# Patient Record
Sex: Male | Born: 1990 | Race: Black or African American | Hispanic: No | Marital: Single | Smoking: Former smoker
Health system: Southern US, Community
[De-identification: ages and names within clinical notes are randomized; demographics above are authoritative.]

## PROBLEM LIST (undated history)

## (undated) DIAGNOSIS — G4730 Sleep apnea, unspecified: Secondary | ICD-10-CM

---

## 2014-04-26 ENCOUNTER — Emergency Department (HOSPITAL_COMMUNITY)
Admission: EM | Admit: 2014-04-26 | Discharge: 2014-04-26 | Disposition: A | Discharge status: Home / Self Care | Payer: Self-pay | Attending: Emergency Medicine | Admitting: Emergency Medicine

## 2014-04-26 ENCOUNTER — Encounter (HOSPITAL_COMMUNITY): Payer: Self-pay | Admitting: Emergency Medicine

## 2014-04-26 DIAGNOSIS — Y9289 Other specified places as the place of occurrence of the external cause: Secondary | ICD-10-CM | POA: Insufficient documentation

## 2014-04-26 DIAGNOSIS — IMO0002 Reserved for concepts with insufficient information to code with codable children: Secondary | ICD-10-CM | POA: Insufficient documentation

## 2014-04-26 DIAGNOSIS — Y9389 Activity, other specified: Secondary | ICD-10-CM | POA: Insufficient documentation

## 2014-04-26 DIAGNOSIS — S01511A Laceration without foreign body of lip, initial encounter: Secondary | ICD-10-CM

## 2014-04-26 DIAGNOSIS — F17200 Nicotine dependence, unspecified, uncomplicated: Secondary | ICD-10-CM | POA: Insufficient documentation

## 2014-04-26 DIAGNOSIS — S01501A Unspecified open wound of lip, initial encounter: Secondary | ICD-10-CM | POA: Insufficient documentation

## 2014-04-26 MED ORDER — TRAMADOL HCL 50 MG PO TABS
50.0000 mg | ORAL_TABLET | Freq: Once | ORAL | Status: AC
  Administered 2014-04-26: 50 mg via ORAL
  Filled 2014-04-26: qty 1

## 2014-04-26 MED ORDER — TRAMADOL HCL 50 MG PO TABS
50.0000 mg | ORAL_TABLET | Freq: Four times a day (QID) | ORAL | Status: DC | PRN
Start: 2014-04-26 — End: 2017-09-20

## 2014-04-26 NOTE — ED Provider Notes (Signed)
CSN: [id number]     Arrival date & time 04/26/14  0201 History   First MD Initiated Contact with Patient 04/26/14 0301     Chief Complaint  Patient presents with  . Lip Laceration     (Consider location/radiation/quality/duration/timing/severity/associated sxs/prior Treatment) Patient is a 23 y.o. male presenting with scalp laceration. The history is provided by the patient.  Head Laceration This is a new problem. The current episode started 1 to 2 hours ago. The problem occurs constantly. The problem has not changed since onset.Pertinent negatives include no chest pain, no abdominal pain, no headaches and no shortness of breath. Nothing aggravates the symptoms. Nothing relieves the symptoms. He has tried nothing for the symptoms. The treatment provided no relief.    History reviewed. No pertinent past medical history. History reviewed. No pertinent past surgical history. No family history on file. History  Substance Use Topics  . Smoking status: Current Every Day Smoker  . Smokeless tobacco: Not on file  . Alcohol Use: Yes    Review of Systems  Respiratory: Negative for shortness of breath.   Cardiovascular: Negative for chest pain.  Gastrointestinal: Negative for abdominal pain.  Neurological: Negative for headaches.  All other systems reviewed and are negative.     Allergies  Review of patient's allergies indicates no known allergies.  Home Medications   Prior to Admission medications   Not on File   BP 126/74  Pulse 66  Temp(Src) 98.6 F (37 C) (Oral)  Resp 16  Wt 133 lb 2 oz (60.385 kg)  SpO2 98% Physical Exam  Constitutional: He is oriented to person, place, and time. He appears well-developed and well-nourished. No distress.  HENT:  Head: Normocephalic and atraumatic. Head is without raccoon's eyes and without Battle's sign.  Right Ear: No mastoid tenderness. No hemotympanum.  Left Ear: No mastoid tenderness. No hemotympanum.  Mouth/Throat: Oropharynx is  clear and moist.    No trismus jaw appropriately seated  Eyes: Pupils are equal, round, and reactive to light.  Neck: Normal range of motion. Neck supple. No tracheal deviation present.  No midline tenderness  Cardiovascular: Normal rate, regular rhythm and intact distal pulses.   Pulmonary/Chest: Effort normal and breath sounds normal. He has no wheezes. He has no rales.  Abdominal: Soft. Bowel sounds are normal. There is no tenderness. There is no rebound and no guarding.  Musculoskeletal: Normal range of motion. He exhibits no edema and no tenderness.  Neurological: He is alert and oriented to person, place, and time.  Skin: Skin is warm and dry.  Psychiatric: He has a normal mood and affect.    ED Course  Procedures (including critical care time) Labs Review Labs Reviewed - No data to display  Imaging Review No results found.   EKG Interpretation None      MDM   Final diagnoses:  None    LACERATION REPAIR Performed by: Jasmine AwePALUMBO-RASCH,Chea Malan K Authorized by: Jasmine AwePALUMBO-RASCH,Cordarrel Stiefel K Consent: Verbal consent obtained. Risks and benefits: risks, benefits and alternatives were discussed Consent given by: patient Patient identity confirmed: provided demographic data Prepped and Draped in normal sterile fashion Wound explored  Laceration Location: lip   Laceration Length: . 9 cm  No Foreign Bodies seen or palpated  Anesthesia: local infiltration  Local anesthetic: lidocaine 1 %   Anesthetic total: 2 ml  Irrigation method: syringe Amount of cleaning: standard  Skin closure: vicryl rapide  Number of sutures: 2  Technique: interrupted  Patient tolerance: Patient tolerated the procedure well with no  immediate complications.   Absorbables.  Mouth rinses     Itamar Mcgowan K Saahil Herbster-Rasch, MD 04/26/14 [phone]

## 2014-04-26 NOTE — Discharge Instructions (Signed)
Absorbable Suture Repair °Absorbable sutures (stitches) hold skin together so you can heal. Keep skin wounds clean and dry for the next 2 to 3 days. Then, you may gently wash your wound and dress it with an antibiotic ointment as recommended. As your wound begins to heal, the sutures are no longer needed, and they typically begin to fall off. This will take 7 to 10 days. After 10 days, if your sutures are loose, you can remove them by wiping with a clean gauze pad or a cotton ball. Do not pull your sutures out. They should wipe away easily. If after 10 days they do not easily wipe away, have your caregiver take them out. Absorbable sutures may be used deep in a wound to help hold it together. If these stitches are below the skin, the body will absorb them completely in 3 to 4 weeks.  °You may need a tetanus shot if: °· You cannot remember when you had your last tetanus shot. °· You have never had a tetanus shot. °If you get a tetanus shot, your arm may swell, get red, and feel warm to the touch. This is common and not a problem. If you need a tetanus shot and you choose not to have one, there is a rare chance of getting tetanus. Sickness from tetanus can be serious. °SEEK IMMEDIATE MEDICAL CARE IF: °· You have redness in the wound area. °· The wound area feels hot to the touch. °· You develop swelling in the wound area. °· You develop pain. °· There is fluid drainage from the wound. °Document Released: 11/16/2004 Document Revised: 01/01/2012 Document Reviewed: 02/28/2011 °ExitCare® Patient Information ©2015 [employer]. This information is not intended to replace advice given to you by your health care provider. Make sure you discuss any questions you have with your health care provider. ° °
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
## 2014-10-05 ENCOUNTER — Encounter (HOSPITAL_COMMUNITY): Payer: Self-pay | Admitting: *Deleted

## 2014-10-05 ENCOUNTER — Emergency Department (HOSPITAL_COMMUNITY)
Admission: EM | Admit: 2014-10-05 | Discharge: 2014-10-05 | Disposition: A | Discharge status: Home / Self Care | Payer: BC Managed Care – PPO | Attending: Emergency Medicine | Admitting: Emergency Medicine

## 2014-10-05 DIAGNOSIS — J0110 Acute frontal sinusitis, unspecified: Secondary | ICD-10-CM | POA: Insufficient documentation

## 2014-10-05 DIAGNOSIS — Z720 Tobacco use: Secondary | ICD-10-CM | POA: Insufficient documentation

## 2014-10-05 DIAGNOSIS — R51 Headache: Secondary | ICD-10-CM | POA: Diagnosis present

## 2014-10-05 DIAGNOSIS — Z79899 Other long term (current) drug therapy: Secondary | ICD-10-CM | POA: Insufficient documentation

## 2014-10-05 MED ORDER — TRAMADOL HCL 50 MG PO TABS
50.0000 mg | ORAL_TABLET | Freq: Once | ORAL | Status: AC
  Administered 2014-10-05: 50 mg via ORAL
  Filled 2014-10-05: qty 1

## 2014-10-05 MED ORDER — AMOXICILLIN 500 MG PO CAPS: 500.0000 mg | ORAL_CAPSULE | Freq: Three times a day (TID) | ORAL | Status: DC

## 2014-10-05 NOTE — Discharge Instructions (Signed)
Amoxicillin as prescribed.  Sudafed over-the-counter as needed as a decongestant.  Ibuprofen 600 mg every 6 hours as needed for pain.  Return to the emergency department if your symptoms substantially worsen or change.   Upper Respiratory Infection, Adult An upper respiratory infection (URI) is also sometimes known as the common cold. The upper respiratory tract includes the nose, sinuses, throat, trachea, and bronchi. Bronchi are the airways leading to the lungs. Most people improve within 1 week, but symptoms can last up to 2 weeks. A residual cough may last even longer.  CAUSES Many different viruses can infect the tissues lining the upper respiratory tract. The tissues become irritated and inflamed and often become very moist. Mucus production is also common. A cold is contagious. You can easily spread the virus to others by oral contact. This includes kissing, sharing a glass, coughing, or sneezing. Touching your mouth or nose and then touching a surface, which is then touched by another person, can also spread the virus. SYMPTOMS  Symptoms typically develop 1 to 3 days after you come in contact with a cold virus. Symptoms vary from person to person. They may include:  Runny nose.  Sneezing.  Nasal congestion.  Sinus irritation.  Sore throat.  Loss of voice (laryngitis).  Cough.  Fatigue.  Muscle aches.  Loss of appetite.  Headache.  Low-grade fever. DIAGNOSIS  You might diagnose your own cold based on familiar symptoms, since most people get a cold 2 to 3 times a year. Your caregiver can confirm this based on your exam. Most importantly, your caregiver can check that your symptoms are not due to another disease such as strep throat, sinusitis, pneumonia, asthma, or epiglottitis. Blood tests, throat tests, and X-rays are not necessary to diagnose a common cold, but they may sometimes be helpful in excluding other more serious diseases. Your caregiver will decide if any  further tests are required. RISKS AND COMPLICATIONS  You may be at risk for a more severe case of the common cold if you smoke cigarettes, have chronic heart disease (such as heart failure) or lung disease (such as asthma), or if you have a weakened immune system. The very young and very old are also at risk for more serious infections. Bacterial sinusitis, middle ear infections, and bacterial pneumonia can complicate the common cold. The common cold can worsen asthma and chronic obstructive pulmonary disease (COPD). Sometimes, these complications can require emergency medical care and may be life-threatening. PREVENTION  The best way to protect against getting a cold is to practice good hygiene. Avoid oral or hand contact with people with cold symptoms. Wash your hands often if contact occurs. There is no clear evidence that vitamin C, vitamin E, echinacea, or exercise reduces the chance of developing a cold. However, it is always recommended to get plenty of rest and practice good nutrition. TREATMENT  Treatment is directed at relieving symptoms. There is no cure. Antibiotics are not effective, because the infection is caused by a virus, not by bacteria. Treatment may include:  Increased fluid intake. Sports drinks offer valuable electrolytes, sugars, and fluids.  Breathing heated mist or steam (vaporizer or shower).  Eating chicken soup or other clear broths, and maintaining good nutrition.  Getting plenty of rest.  Using gargles or lozenges for comfort.  Controlling fevers with ibuprofen or acetaminophen as directed by your caregiver.  Increasing usage of your inhaler if you have asthma. Zinc gel and zinc lozenges, taken in the first 24 hours of the common  cold, can shorten the duration and lessen the severity of symptoms. Pain medicines may help with fever, muscle aches, and throat pain. A variety of non-prescription medicines are available to treat congestion and runny nose. Your caregiver  can make recommendations and may suggest nasal or lung inhalers for other symptoms.  HOME CARE INSTRUCTIONS   Only take over-the-counter or prescription medicines for pain, discomfort, or fever as directed by your caregiver.  Use a warm mist humidifier or inhale steam from a shower to increase air moisture. This may keep secretions moist and make it easier to breathe.  Drink enough water and fluids to keep your urine clear or pale yellow.  Rest as needed.  Return to work when your temperature has returned to normal or as your caregiver advises. You may need to stay home longer to avoid infecting others. You can also use a face mask and careful hand washing to prevent spread of the virus. SEEK MEDICAL CARE IF:   After the first few days, you feel you are getting worse rather than better.  You need your caregiver's advice about medicines to control symptoms.  You develop chills, worsening shortness of breath, or brown or red sputum. These may be signs of pneumonia.  You develop yellow or brown nasal discharge or pain in the face, especially when you bend forward. These may be signs of sinusitis.  You develop a fever, swollen neck glands, pain with swallowing, or white areas in the back of your throat. These may be signs of strep throat. SEEK IMMEDIATE MEDICAL CARE IF:   You have a fever.  You develop severe or persistent headache, ear pain, sinus pain, or chest pain.  You develop wheezing, a prolonged cough, cough up blood, or have a change in your usual mucus (if you have chronic lung disease).  You develop sore muscles or a stiff neck. Document Released: 04/04/2001 Document Revised: 01/01/2012 Document Reviewed: 01/14/2014 [facility] Patient Information 2015 [place], [place]. This information is not intended to replace advice given to you by your health care provider. Make sure you discuss any questions you have with your health care provider.

## 2014-10-05 NOTE — ED Provider Notes (Signed)
CSN: [id number]     Arrival date & time 10/05/14  0515 History   First MD Initiated Contact with Patient 10/05/14 0545     Chief Complaint  Patient presents with  . Headache     (Consider location/radiation/quality/duration/timing/severity/associated sxs/prior Treatment) HPI Comments: Patient is a [age] year old male with no significant past medical history. He presents with complaints of headache that he describes as "pressure behind his eyes". He is also describing pressure in his nose. He denies nasal discharge or fever. He states he has had sinus infections in the past and this feels similar.  Patient is a 23 y.o. male presenting with headaches. The history is provided by the patient.  Headache Pain location:  Frontal Quality:  Dull Radiates to:  Does not radiate Onset quality:  Gradual Duration:  1 week Timing:  Constant Progression:  Worsening Chronicity:  New Similar to prior headaches: yes   Relieved by:  Nothing Worsened by:  Nothing tried Ineffective treatments:  None tried Associated symptoms: congestion   Associated symptoms: no cough, no drainage and no fever     History reviewed. No pertinent past medical history. History reviewed. No pertinent past surgical history. No family history on file. History  Substance Use Topics  . Smoking status: Current Every Day Smoker  . Smokeless tobacco: Not on file  . Alcohol Use: Yes    Review of Systems  Constitutional: Negative for fever.  HENT: Positive for congestion. Negative for postnasal drip.   Respiratory: Negative for cough.   Neurological: Positive for headaches.  All other systems reviewed and are negative.     Allergies  Review of patient's allergies indicates no known allergies.  Home Medications   Prior to Admission medications   Medication Sig Start Date End Date Taking? Authorizing Provider  traMADol (ULTRAM) 50 MG tablet Take 1 tablet (50 mg total) by mouth every 6 (six) hours as  needed. Patient taking differently: Take 50 mg by mouth every 6 (six) hours as needed for moderate pain.  04/26/14  Yes April K Palumbo-Rasch, MD  Multiple Vitamin (MULTIVITAMIN WITH MINERALS) TABS tablet Take 1 tablet by mouth daily.    Historical Provider, MD   BP 119/76 mmHg  Pulse 60  Temp(Src) 97.8 F (36.6 C)  Resp 18  Ht 5\' 8"  (1.727 m)  Wt 131 lb (59.421 kg)  BMI 19.92 kg/m2  SpO2 98% Physical Exam  Constitutional: He is oriented to person, place, and time. He appears well-developed and well-nourished. No distress.  HENT:  Head: Normocephalic and atraumatic.  Mouth/Throat: Oropharynx is clear and moist.  There is maxillofacial and frontal sinus tenderness.  Neck: Normal range of motion. Neck supple.  Cardiovascular: Normal rate, regular rhythm and normal heart sounds.   No murmur heard. Pulmonary/Chest: Effort normal and breath sounds normal. No respiratory distress. He has no wheezes.  Abdominal: Soft.  Musculoskeletal: Normal range of motion. He exhibits no edema.  Lymphadenopathy:    He has no cervical adenopathy.  Neurological: He is alert and oriented to person, place, and time.  Skin: Skin is warm and dry. He is not diaphoretic.  Nursing note and vitals reviewed.   ED Course  Procedures (including critical care time) Labs Review Labs Reviewed - No data to display  Imaging Review No results found.   EKG Interpretation None      MDM   Final diagnoses:  None    We'll treat as sinusitis with amoxicillin and decongestants.    Geoffery Lyonsouglas Hawthorne Day, MD 10/05/14 [id number]

## 2014-10-05 NOTE — ED Notes (Signed)
The pt is c/o a headache and sinus pressure for 3 days  No n or v

## 2015-01-18 ENCOUNTER — Emergency Department (HOSPITAL_COMMUNITY)
Admission: EM | Admit: 2015-01-18 | Discharge: 2015-01-18 | Disposition: A | Discharge status: Home / Self Care | Payer: BLUE CROSS/BLUE SHIELD | Attending: Emergency Medicine | Admitting: Emergency Medicine

## 2015-01-18 ENCOUNTER — Encounter (HOSPITAL_COMMUNITY): Payer: Self-pay | Admitting: Neurology

## 2015-01-18 DIAGNOSIS — Z8669 Personal history of other diseases of the nervous system and sense organs: Secondary | ICD-10-CM | POA: Diagnosis not present

## 2015-01-18 DIAGNOSIS — R197 Diarrhea, unspecified: Secondary | ICD-10-CM | POA: Diagnosis not present

## 2015-01-18 DIAGNOSIS — R945 Abnormal results of liver function studies: Secondary | ICD-10-CM | POA: Insufficient documentation

## 2015-01-18 DIAGNOSIS — R7989 Other specified abnormal findings of blood chemistry: Secondary | ICD-10-CM

## 2015-01-18 DIAGNOSIS — Z720 Tobacco use: Secondary | ICD-10-CM | POA: Insufficient documentation

## 2015-01-18 DIAGNOSIS — R110 Nausea: Secondary | ICD-10-CM | POA: Insufficient documentation

## 2015-01-18 DIAGNOSIS — R109 Unspecified abdominal pain: Secondary | ICD-10-CM | POA: Diagnosis present

## 2015-01-18 HISTORY — DX: Sleep apnea, unspecified: G47.30

## 2015-01-18 LAB — CBC WITH DIFFERENTIAL/PLATELET
Basophils Absolute: 0 10*3/uL (ref 0.0–0.1)
Basophils Relative: 0 % (ref 0–1)
EOS PCT: 1 % (ref 0–5)
Eosinophils Absolute: 0 10*3/uL (ref 0.0–0.7)
HCT: 45.3 % (ref 39.0–52.0)
Hemoglobin: 16.4 g/dL (ref 13.0–17.0)
LYMPHS PCT: 40 % (ref 12–46)
Lymphs Abs: 1.6 10*3/uL (ref 0.7–4.0)
MCH: 31.5 pg (ref 26.0–34.0)
MCHC: 36.2 g/dL — ABNORMAL HIGH (ref 30.0–36.0)
MCV: 86.9 fL (ref 78.0–100.0)
Monocytes Absolute: 0.7 10*3/uL (ref 0.1–1.0)
Monocytes Relative: 18 % — ABNORMAL HIGH (ref 3–12)
NEUTROS ABS: 1.6 10*3/uL — AB (ref 1.7–7.7)
Neutrophils Relative %: 41 % — ABNORMAL LOW (ref 43–77)
Platelets: 217 10*3/uL (ref 150–400)
RBC: 5.21 MIL/uL (ref 4.22–5.81)
RDW: 11.9 % (ref 11.5–15.5)
WBC: 4 10*3/uL (ref 4.0–10.5)

## 2015-01-18 LAB — COMPREHENSIVE METABOLIC PANEL
ALBUMIN: 4 g/dL (ref 3.5–5.2)
ALK PHOS: 50 U/L (ref 39–117)
ALT: 96 U/L — AB (ref 0–53)
AST: 339 U/L — AB (ref 0–37)
Anion gap: 11 (ref 5–15)
BUN: 12 mg/dL (ref 6–23)
CO2: 27 mmol/L (ref 19–32)
CREATININE: 1.22 mg/dL (ref 0.50–1.35)
Calcium: 9.3 mg/dL (ref 8.4–10.5)
Chloride: 95 mmol/L — ABNORMAL LOW (ref 96–112)
GFR calc non Af Amer: 82 mL/min — ABNORMAL LOW (ref >90)
Glucose, Bld: 100 mg/dL — ABNORMAL HIGH (ref 70–99)
Potassium: 4.3 mmol/L (ref 3.5–5.1)
SODIUM: 133 mmol/L — AB (ref 135–145)
TOTAL PROTEIN: 7 g/dL (ref 6.0–8.3)
Total Bilirubin: 1.1 mg/dL (ref 0.3–1.2)

## 2015-01-18 LAB — LIPASE, BLOOD: LIPASE: 17 U/L (ref 11–59)

## 2015-01-18 MED ORDER — ONDANSETRON 4 MG PO TBDP: ORAL_TABLET | ORAL | Status: DC

## 2015-01-18 MED ORDER — ONDANSETRON 4 MG PO TBDP
4.0000 mg | ORAL_TABLET | Freq: Once | ORAL | Status: AC
  Administered 2015-01-18: 4 mg via ORAL
  Filled 2015-01-18: qty 1

## 2015-01-18 NOTE — ED Notes (Signed)
Pt reports can't eat for 3 days b/c it causes lower abdominal pain. Reports gurgling in abdomen and diarrhea. No vomiting. Also feels body temp is warm but not fever. Has stuffy nose and congestion and can't sleep.

## 2015-01-18 NOTE — Discharge Instructions (Signed)
Continue stay hydrated, use Zofran as needed for nausea. See a physician if you develop focal abdominal pain, fevers, recurrent vomiting or worsening symptoms. Have your liver function retested by primary doctor. Avoid alcohol.  If you were given medicines take as directed.  If you are on coumadin or contraceptives realize their levels and effectiveness is altered by many different medicines.  If you have any reaction (rash, tongues swelling, other) to the medicines stop taking and see a physician.   Please follow up as directed and return to the ER or see a physician for new or worsening symptoms.  Thank you. Filed Vitals:   01/18/15 1201  BP: 122/60  Pulse: 88  Temp: 99.2 F (37.3 C)  TempSrc: Oral  Resp: 18  SpO2: 95%

## 2015-01-18 NOTE — ED Provider Notes (Signed)
CSN: [id number]     Arrival date & time 01/18/15  1157 History   First MD Initiated Contact with Patient 01/18/15 1409     Chief Complaint  Patient presents with  . Abdominal Pain     (Consider location/radiation/quality/duration/timing/severity/associated sxs/prior Treatment) HPI Comments: 24 year old male presents to ER for 2 separate complaints. Patient has been congested and difficulty with sleeping that he said in the past, patient has been on Ambien in the past. Patient does not have a regular sleeping schedule. Patient also has had mild crampy abdominal pain lower and recurrent nausea with mild diarrhea nonbloody. No significant sick contacts no recent antibiotics no recent travel. No abdominal surgery history. Patient has tolerated oral fluids including Gatorade. Symptoms intermittent. Patient denies IV drug use, significant alcohol abuse, hepatitis history.  Patient is a 24 y.o. male presenting with abdominal pain. The history is provided by the patient.  Abdominal Pain Associated symptoms: diarrhea and nausea   Associated symptoms: no chest pain, no chills, no dysuria, no fever, no shortness of breath and no vomiting     Past Medical History  Diagnosis Date  . Sleep apnea    History reviewed. No pertinent past surgical history. No family history on file. History  Substance Use Topics  . Smoking status: Current Every Day Smoker  . Smokeless tobacco: Not on file  . Alcohol Use: Yes    Review of Systems  Constitutional: Positive for appetite change. Negative for fever and chills.  HENT: Negative for congestion.   Eyes: Negative for visual disturbance.  Respiratory: Negative for shortness of breath.   Cardiovascular: Negative for chest pain.  Gastrointestinal: Positive for nausea, abdominal pain and diarrhea. Negative for vomiting.  Genitourinary: Negative for dysuria and flank pain.  Musculoskeletal: Negative for back pain, neck pain and neck stiffness.  Skin: Negative  for rash.  Neurological: Negative for light-headedness and headaches.      Allergies  Review of patient's allergies indicates no known allergies.  Home Medications   Prior to Admission medications   Medication Sig Start Date End Date Taking? Authorizing Provider  amoxicillin (AMOXIL) 500 MG capsule Take 1 capsule (500 mg total) by mouth 3 (three) times daily. Patient not taking: Reported on 01/18/2015 10/05/14   Geoffery Lyons, MD  ondansetron (ZOFRAN ODT) 4 MG disintegrating tablet  ODT q4 hours prn nausea/vomit 01/18/15   Blane Ohara, MD  traMADol (ULTRAM) 50 MG tablet Take 1 tablet (50 mg total) by mouth every 6 (six) hours as needed. Patient not taking: Reported on 01/18/2015 04/26/14   April Palumbo, MD   BP 122/60 mmHg  Pulse 88  Temp(Src) 99.2 F (37.3 C) (Oral)  Resp 18  SpO2 95% Physical Exam  Constitutional: He is oriented to person, place, and time. He appears well-developed and well-nourished.  HENT:  Head: Normocephalic and atraumatic.  Eyes: Conjunctivae are normal. Right eye exhibits no discharge. Left eye exhibits no discharge.  Neck: Normal range of motion. Neck supple. No tracheal deviation present.  Cardiovascular: Normal rate and regular rhythm.   Pulmonary/Chest: Effort normal and breath sounds normal.  Abdominal: Soft. He exhibits no distension. There is no tenderness. There is no guarding.  Musculoskeletal: He exhibits no edema.  Neurological: He is alert and oriented to person, place, and time.  Skin: Skin is warm. No rash noted.  Psychiatric: He has a normal mood and affect.  Nursing note and vitals reviewed.   ED Course  Procedures (including critical care time)  EMERGENCY DEPARTMENT BILIARY ULTRASOUND INTERPRETATION "  Study: Limited Abdominal Ultrasound of the gallbladder and common bile duct."  INDICATIONS: Abdominal pain and Nausea Indication: Multiple views of the gallbladder and common bile duct were obtained in real-time with a  Multi-frequency probe." PERFORMED BY:  Myself IMAGES ARCHIVED?: Yes FINDINGS: Gallstones absent, Gallbladder wall normal in thickness, Sonographic Murphy's sign absent and Common bile duct normal in size LIMITATIONS: Bowel Gas INTERPRETATION: Normal  CPT Code [id number] (limited abdominal)    Labs Review Labs Reviewed  CBC WITH DIFFERENTIAL/PLATELET - Abnormal; Notable for the following:    MCHC 36.2 (*)    Neutrophils Relative % 41 (*)    Neutro Abs 1.6 (*)    Monocytes Relative 18 (*)    All other components within normal limits  COMPREHENSIVE METABOLIC PANEL - Abnormal; Notable for the following:    Sodium 133 (*)    Chloride 95 (*)    Glucose, Bld 100 (*)    AST 339 (*)    ALT 96 (*)    GFR calc non Af Amer 82 (*)    All other components within normal limits  LIPASE, BLOOD  URINALYSIS, ROUTINE W REFLEX MICROSCOPIC    Imaging Review No results found.   EKG Interpretation None      MDM   Final diagnoses:  LFT elevation  Nausea  Diarrhea   Patient presents with recurrent nausea and mild clinical dehydration. Patient overall well-appearing no peritonitis no abdominal pain on exam. Bedside ultrasound done due to nausea and LFT elevation no significant findings. Likely viral process, discussed avoiding alcohol and close follow-up outpatient. Discussed melatonin and good sleep schedule.  Results and differential diagnosis were discussed with the patient/parent/guardian. Close follow up outpatient was discussed, comfortable with the plan.   Medications  ondansetron (ZOFRAN-ODT) disintegrating tablet 4 mg (not administered)    Filed Vitals:   01/18/15 1201  BP: 122/60  Pulse: 88  Temp: 99.2 F (37.3 C)  TempSrc: Oral  Resp: 18  SpO2: 95%    Final diagnoses:  LFT elevation  Nausea  Diarrhea        Blane OharaJoshua Marcia Hartwell, MD 01/18/15 1454

## 2015-06-08 DIAGNOSIS — Y999 Unspecified external cause status: Secondary | ICD-10-CM | POA: Insufficient documentation

## 2015-06-08 DIAGNOSIS — Z720 Tobacco use: Secondary | ICD-10-CM | POA: Insufficient documentation

## 2015-06-08 DIAGNOSIS — X58XXXA Exposure to other specified factors, initial encounter: Secondary | ICD-10-CM | POA: Insufficient documentation

## 2015-06-08 DIAGNOSIS — Z79899 Other long term (current) drug therapy: Secondary | ICD-10-CM | POA: Insufficient documentation

## 2015-06-08 DIAGNOSIS — Z8669 Personal history of other diseases of the nervous system and sense organs: Secondary | ICD-10-CM | POA: Insufficient documentation

## 2015-06-08 DIAGNOSIS — S8392XA Sprain of unspecified site of left knee, initial encounter: Secondary | ICD-10-CM | POA: Insufficient documentation

## 2015-06-08 DIAGNOSIS — Y9302 Activity, running: Secondary | ICD-10-CM | POA: Insufficient documentation

## 2015-06-08 DIAGNOSIS — Y929 Unspecified place or not applicable: Secondary | ICD-10-CM | POA: Insufficient documentation

## 2015-06-08 NOTE — ED Notes (Signed)
Chronic lt knee pain since 2009.  The pain became worse today

## 2015-06-09 ENCOUNTER — Encounter (HOSPITAL_COMMUNITY): Payer: Self-pay | Admitting: *Deleted

## 2015-06-09 ENCOUNTER — Emergency Department (HOSPITAL_COMMUNITY): Payer: BLUE CROSS/BLUE SHIELD

## 2015-06-09 ENCOUNTER — Emergency Department (HOSPITAL_COMMUNITY)
Admission: EM | Admit: 2015-06-09 | Discharge: 2015-06-09 | Disposition: A | Discharge status: Home / Self Care | Payer: Self-pay | Attending: Emergency Medicine | Admitting: Emergency Medicine

## 2015-06-09 DIAGNOSIS — S8392XA Sprain of unspecified site of left knee, initial encounter: Secondary | ICD-10-CM

## 2015-06-09 DIAGNOSIS — M25562 Pain in left knee: Secondary | ICD-10-CM

## 2015-06-09 MED ORDER — HYDROCODONE-ACETAMINOPHEN 5-325 MG PO TABS: 1.0000 | ORAL_TABLET | Freq: Four times a day (QID) | ORAL | Status: DC | PRN

## 2015-06-09 MED ORDER — HYDROCODONE-ACETAMINOPHEN 5-325 MG PO TABS
1.0000 | ORAL_TABLET | Freq: Once | ORAL | Status: AC
  Administered 2015-06-09: 1 via ORAL
  Filled 2015-06-09: qty 1

## 2015-06-09 MED ORDER — NAPROXEN 500 MG PO TABS: 500.0000 mg | ORAL_TABLET | Freq: Two times a day (BID) | ORAL | Status: DC | PRN

## 2015-06-09 NOTE — ED Provider Notes (Signed)
CSN: [id number]     Arrival date & time 06/08/15  2341 History   First MD Initiated Contact with Patient 06/09/15 0038     Chief Complaint  Patient presents with  . Knee Pain     (Consider location/radiation/quality/duration/timing/severity/associated sxs/prior Treatment) HPI Comments: Jonathan Curry is a 24 y.o. male who presents to the ED with complaints of sudden onset left knee pain that began after he was running upstairs at around 6 PM. He reports he has had an injury to this knee in the past, and believes he reinjured it. He reports 7/10 intermittent sharp lateral left knee pain that intermittently radiates down into the calf, worse with extension and weightbearing, with no treatments tried prior to arrival. Associated symptoms include intermittent tingling down the posterior aspect of the left calf only occurs with certain movements. He denies any swelling, erythema, warmth, fevers, chills, chest pain, shortness of breath, abdominal pain, nausea, vomiting, dysuria, hematuria, numbness, or weakness. He has never been seen by an orthopedist in the past, he states that in 2009 he was running and twisted his knee and ever since then he has had knee pain.  Patient is a 24 y.o. male presenting with knee pain. The history is provided by the patient. No language interpreter was used.  Knee Pain Location:  Knee Time since incident:  6 hours Injury: no   Knee location:  L knee Pain details:    Quality:  Sharp   Radiates to:  L leg   Severity:  Moderate   Onset quality:  Sudden   Duration:  6 hours   Timing:  Intermittent   Progression:  Unchanged Chronicity:  Recurrent Prior injury to area:  Yes Relieved by:  None tried Worsened by:  Extension Ineffective treatments:  None tried Associated symptoms: tingling (intermittently down posterior calf, none ongoing)   Associated symptoms: no decreased ROM, no fever, no muscle weakness, no numbness and no swelling     Past Medical History   Diagnosis Date  . Sleep apnea    History reviewed. No pertinent past surgical history. No family history on file. Social History  Substance Use Topics  . Smoking status: Current Every Day Smoker  . Smokeless tobacco: None  . Alcohol Use: Yes    Review of Systems  Constitutional: Negative for fever and chills.  Respiratory: Negative for shortness of breath.   Cardiovascular: Negative for chest pain.  Gastrointestinal: Negative for nausea, vomiting and abdominal pain.  Genitourinary: Negative for dysuria and hematuria.  Musculoskeletal: Positive for arthralgias (L knee). Negative for myalgias.  Skin: Negative for color change and wound.  Allergic/Immunologic: Negative for immunocompromised state.  Neurological: Negative for weakness and numbness.  Psychiatric/Behavioral: Negative for confusion.    10 Systems reviewed and are negative for acute change except as noted in the HPI.   Allergies  Review of patient's allergies indicates no known allergies.  Home Medications   Prior to Admission medications   Medication Sig Start Date End Date Taking? Authorizing Provider  amoxicillin (AMOXIL) 500 MG capsule Take 1 capsule (500 mg total) by mouth 3 (three) times daily. Patient not taking: Reported on 01/18/2015 10/05/14   Geoffery Lyons, MD  ondansetron (ZOFRAN ODT) 4 MG disintegrating tablet  ODT q4 hours prn nausea/vomit 01/18/15   Blane Ohara, MD  traMADol (ULTRAM) 50 MG tablet Take 1 tablet (50 mg total) by mouth every 6 (six) hours as needed. Patient not taking: Reported on 01/18/2015 04/26/14   April Palumbo, MD   BP  114/72 mmHg  Pulse 74  Temp(Src) 97.8 F (36.6 C)  Resp 16  Ht 5\' 8"  (1.727 m)  Wt 127 lb 5 oz (57.749 kg)  BMI 19.36 kg/m2  SpO2 97% Physical Exam  Constitutional: He is oriented to person, place, and time. Vital signs are normal. He appears well-developed and well-nourished.  Non-toxic appearance. No distress.  Afebrile, nontoxic, NAD  HENT:  Head:  Normocephalic and atraumatic.  Mouth/Throat: Mucous membranes are normal.  Eyes: Conjunctivae and EOM are normal. Right eye exhibits no discharge. Left eye exhibits no discharge.  Neck: Normal range of motion. Neck supple.  Cardiovascular: Normal rate and intact distal pulses.   Pulmonary/Chest: Effort normal. No respiratory distress.  Abdominal: Normal appearance. He exhibits no distension.  Musculoskeletal: Normal range of motion.       Left knee: He exhibits normal range of motion, no swelling, no effusion, no deformity, no laceration, no erythema, normal alignment, no LCL laxity, normal patellar mobility and no MCL laxity. Tenderness found. Lateral joint line tenderness noted.  L knee with FROM intact, with lateral joint line TTP, no swelling/effusion/deformity, no bruising/abrasions/erythema/warmth, no abnormal alignment or patellar mobility, no varus/valgus laxity, neg anterior drawer test, no crepitus. Strength and sensation grossly intact, distal pulses intact. Difficulty with weight bearing. No calf tenderness.    Neurological: He is alert and oriented to person, place, and time. He has normal strength. No sensory deficit.  Skin: Skin is warm, dry and intact. No rash noted.  Psychiatric: He has a normal mood and affect.  Nursing note and vitals reviewed.   ED Course  Procedures (including critical care time) Labs Review Labs Reviewed - No data to display  Imaging Review Dg Knee Complete 4 Views Left  06/09/2015   CLINICAL DATA:  Left knee pain after running upstairs. Initial encounter.  EXAM: LEFT KNEE - COMPLETE 4+ VIEW  COMPARISON:  None.  FINDINGS: There is no evidence of fracture, dislocation, or joint effusion. There is no evidence of arthropathy or other focal bone abnormality. Soft tissues are unremarkable.  IMPRESSION: Negative.   Electronically Signed   By: Marnee Spring M.D.   On: 06/09/2015 01:34   I have personally reviewed and evaluated these images and lab results  as part of my medical decision-making.   EKG Interpretation None      MDM   Final diagnoses:  Left knee sprain, initial encounter  Left knee pain    24 y.o. male here with acute on chronic L knee pain after running up stairs. NVI with soft compartments. Tenderness near lateral aspect. Some tingling posteriorly intermittently in calf but sensation intact. Will give pain meds and xray to eval for fx. Will reassess shortly.  1:50 AM Xray neg. Likely meniscal injury vs ligamentous injury. Will tx with knee sleeve and crutches, discussed RICE therapy, will have him f/up with ortho in 1wk, and will send home with pain meds. I explained the diagnosis and have given explicit precautions to return to the ER including for any other new or worsening symptoms. The patient understands and accepts the medical plan as it's been dictated and I have answered their questions. Discharge instructions concerning home care and prescriptions have been given. The patient is STABLE and is discharged to home in good condition.   BP 114/72 mmHg  Pulse 74  Temp(Src) 97.8 F (36.6 C)  Resp 16  Ht 5\' 8"  (1.727 m)  Wt 127 lb 5 oz (57.749 kg)  BMI 19.36 kg/m2  SpO2 97%  Meds ordered this encounter  Medications  . HYDROcodone-acetaminophen (NORCO/VICODIN) 5-325 MG per tablet 1 tablet    Sig:   . naproxen (NAPROSYN) 500 MG tablet    Sig: Take 1 tablet (500 mg total) by mouth 2 (two) times daily as needed for mild pain, moderate pain or headache (TAKE WITH MEALS.).    Dispense:  20 tablet    Refill:  0  . HYDROcodone-acetaminophen (NORCO) 5-325 MG per tablet    Sig: Take 1 tablet by mouth every 6 (six) hours as needed for severe pain.    Dispense:  6 tablet    Refill:  0       Sameen Leas Camprubi-Soms, PA-C 06/09/15 0155  Derwood Kaplan, MD 06/10/15 1000

## 2015-06-09 NOTE — Discharge Instructions (Signed)
Wear knee sleeve for at least 2 weeks for stabilization of knee. Use crutches as needed for comfort. Ice and elevate knee throughout the day. Alternate between naprosyn and norco for pain relief. Do not drive or operate machinery with pain medication use. Call orthopedic follow up today or tomorrow to schedule followup appointment for recheck of ongoing knee pain in one to two weeks that can be canceled with a 24-48 hour notice if complete resolution of pain. Return to the ER for changes or worsening symptoms.   Knee Pain The knee is the complex joint between your thigh and your lower leg. It is made up of bones, tendons, ligaments, and cartilage. The bones that make up the knee are:  The femur in the thigh.  The tibia and fibula in the lower leg.  The patella or kneecap riding in the groove on the lower femur. CAUSES  Knee pain is a common complaint with many causes. A few of these causes are:  Injury, such as:  A ruptured ligament or tendon injury.  Torn cartilage.  Medical conditions, such as:  Gout  Arthritis  Infections  Overuse, over training, or overdoing a physical activity. Knee pain can be minor or severe. Knee pain can accompany debilitating injury. Minor knee problems often respond well to self-care measures or get well on their own. More serious injuries may need medical intervention or even surgery. SYMPTOMS The knee is complex. Symptoms of knee problems can vary widely. Some of the problems are:  Pain with movement and weight bearing.  Swelling and tenderness.  Buckling of the knee.  Inability to straighten or extend your knee.  Your knee locks and you cannot straighten it.  Warmth and redness with pain and fever.  Deformity or dislocation of the kneecap. DIAGNOSIS  Determining what is wrong may be very straight forward such as when there is an injury. It can also be challenging because of the complexity of the knee. Tests to make a diagnosis may  include:  Your caregiver taking a history and doing a physical exam.  Routine X-rays can be used to rule out other problems. X-rays will not reveal a cartilage tear. Some injuries of the knee can be diagnosed by:  Arthroscopy a surgical technique by which a small video camera is inserted through tiny incisions on the sides of the knee. This procedure is used to examine and repair internal knee joint problems. Tiny instruments can be used during arthroscopy to repair the torn knee cartilage (meniscus).  Arthrography is a radiology technique. A contrast liquid is directly injected into the knee joint. Internal structures of the knee joint then become visible on X-ray film.  An MRI scan is a non X-ray radiology procedure in which magnetic fields and a computer produce two- or three-dimensional images of the inside of the knee. Cartilage tears are often visible using an MRI scanner. MRI scans have largely replaced arthrography in diagnosing cartilage tears of the knee.  Blood work.  Examination of the fluid that helps to lubricate the knee joint (synovial fluid). This is done by taking a sample out using a needle and a syringe. TREATMENT The treatment of knee problems depends on the cause. Some of these treatments are:  Depending on the injury, proper casting, splinting, surgery, or physical therapy care will be needed.  Give yourself adequate recovery time. Do not overuse your joints. If you begin to get sore during workout routines, back off. Slow down or do fewer repetitions.  For repetitive  activities such as cycling or running, maintain your strength and nutrition.  Alternate muscle groups. For example, if you are a weight lifter, work the upper body on one day and the lower body the next.  Either tight or weak muscles do not give the proper support for your knee. Tight or weak muscles do not absorb the stress placed on the knee joint. Keep the muscles surrounding the knee strong.  Take  care of mechanical problems.  If you have flat feet, orthotics or special shoes may help. See your caregiver if you need help.  Arch supports, sometimes with wedges on the inner or outer aspect of the heel, can help. These can shift pressure away from the side of the knee most bothered by osteoarthritis.  A brace called an "unloader" brace also may be used to help ease the pressure on the most arthritic side of the knee.  If your caregiver has prescribed crutches, braces, wraps or ice, use as directed. The acronym for this is PRICE. This means protection, rest, ice, compression, and elevation.  Nonsteroidal anti-inflammatory drugs (NSAIDs), can help relieve pain. But if taken immediately after an injury, they may actually increase swelling. Take NSAIDs with food in your stomach. Stop them if you develop stomach problems. Do not take these if you have a history of ulcers, stomach pain, or bleeding from the bowel. Do not take without your caregiver's approval if you have problems with fluid retention, heart failure, or kidney problems.  For ongoing knee problems, physical therapy may be helpful.  Glucosamine and chondroitin are over-the-counter dietary supplements. Both may help relieve the pain of osteoarthritis in the knee. These medicines are different from the usual anti-inflammatory drugs. Glucosamine may decrease the rate of cartilage destruction.  Injections of a corticosteroid drug into your knee joint may help reduce the symptoms of an arthritis flare-up. They may provide pain relief that lasts a few months. You may have to wait a few months between injections. The injections do have a small increased risk of infection, water retention, and elevated blood sugar levels.  Hyaluronic acid injected into damaged joints may ease pain and provide lubrication. These injections may work by reducing inflammation. A series of shots may give relief for as long as 6 months.  Topical painkillers.  Applying certain ointments to your skin may help relieve the pain and stiffness of osteoarthritis. Ask your pharmacist for suggestions. Many over the-counter products are approved for temporary relief of arthritis pain.  In some countries, doctors often prescribe topical NSAIDs for relief of chronic conditions such as arthritis and tendinitis. A review of treatment with NSAID creams found that they worked as well as oral medications but without the serious side effects. PREVENTION  Maintain a healthy weight. Extra pounds put more strain on your joints.  Get strong, stay limber. Weak muscles are a common cause of knee injuries. Stretching is important. Include flexibility exercises in your workouts.  Be smart about exercise. If you have osteoarthritis, chronic knee pain or recurring injuries, you may need to change the way you exercise. This does not mean you have to stop being active. If your knees ache after jogging or playing basketball, consider switching to swimming, water aerobics, or other low-impact activities, at least for a few days a week. Sometimes limiting high-impact activities will provide relief.  Make sure your shoes fit well. Choose footwear that is right for your sport.  Protect your knees. Use the proper gear for knee-sensitive activities. Use kneepads when  playing volleyball or laying carpet. Buckle your seat belt every time you drive. Most shattered kneecaps occur in car accidents.  Rest when you are tired. SEEK MEDICAL CARE IF:  You have knee pain that is continual and does not seem to be getting better.  SEEK IMMEDIATE MEDICAL CARE IF:  Your knee joint feels hot to the touch and you have a high fever. MAKE SURE YOU:   Understand these instructions.  Will watch your condition.  Will get help right away if you are not doing well or get worse. Document Released: 08/06/2007 Document Revised: 01/01/2012 Document Reviewed: 08/06/2007 [facility] Patient Information 2015  [place], [place]. This information is not intended to replace advice given to you by your health care provider. Make sure you discuss any questions you have with your health care provider.  Knee Sprain A knee sprain is a tear in the strong bands of tissue that connect the bones (ligaments) of your knee. HOME CARE  Raise (elevate) your injured knee to lessen puffiness (swelling).  To ease pain and puffiness, put ice on the injured area.  Put ice in a plastic bag.  Place a towel between your skin and the bag.  Leave the ice on for 20 minutes, 2-3 times a day.  Only take medicine as told by your doctor.  Do not leave your knee unprotected until pain and stiffness go away (usually 4-6 weeks).  If you have a cast or splint, do not get it wet. If your doctor told you to not take it off, cover it with a plastic bag when you shower or bathe. Do not swim.  Your doctor may have you do exercises to prevent or limit permanent weakness and stiffness. GET HELP RIGHT AWAY IF:   Your cast or splint becomes damaged.  Your pain gets worse.  You have a lot of pain, puffiness, or numbness below the cast or splint. MAKE SURE YOU:   Understand these instructions.  Will watch your condition.  Will get help right away if you are not doing well or get worse. Document Released: 09/27/2009 Document Revised: 10/14/2013 Document Reviewed: 06/17/2013 [facility] Patient Information 2015 [place], [place]. This information is not intended to replace advice given to you by your health care provider. Make sure you discuss any questions you have with your health care provider.  Cryotherapy Cryotherapy is when you put ice on your injury. Ice helps lessen pain and puffiness (swelling) after an injury. Ice works the best when you start using it in the first 24 to 48 hours after an injury. HOME CARE  Put a dry or damp towel between the ice pack and your skin.  You may press gently on the ice pack.  Leave the ice  on for no more than 10 to 20 minutes at a time.  Check your skin after 5 minutes to make sure your skin is okay.  Rest at least 20 minutes between ice pack uses.  Stop using ice when your skin loses feeling (numbness).  Do not use ice on someone who cannot tell you when it hurts. This includes small children and people with memory problems (dementia). GET HELP RIGHT AWAY IF:  You have white spots on your skin.  Your skin turns blue or pale.  Your skin feels waxy or hard.  Your puffiness gets worse. MAKE SURE YOU:   Understand these instructions.  Will watch your condition.  Will get help right away if you are not doing well or get worse. Document  Released: 03/27/2008 Document Revised: 01/01/2012 Document Reviewed: 06/01/2011 [facility] Patient Information 2015 [place], [place]. This information is not intended to replace advice given to you by your health care provider. Make sure you discuss any questions you have with your health care provider.

## 2016-07-04 IMAGING — DX DG KNEE COMPLETE 4+V*L*
4 series · 4 of 4 positions shown · non-contrast
Comparison: None.

CLINICAL DATA: Left knee pain after running upstairs. Initial
encounter.

EXAM:
LEFT KNEE - COMPLETE 4+ VIEW

[knee ap]
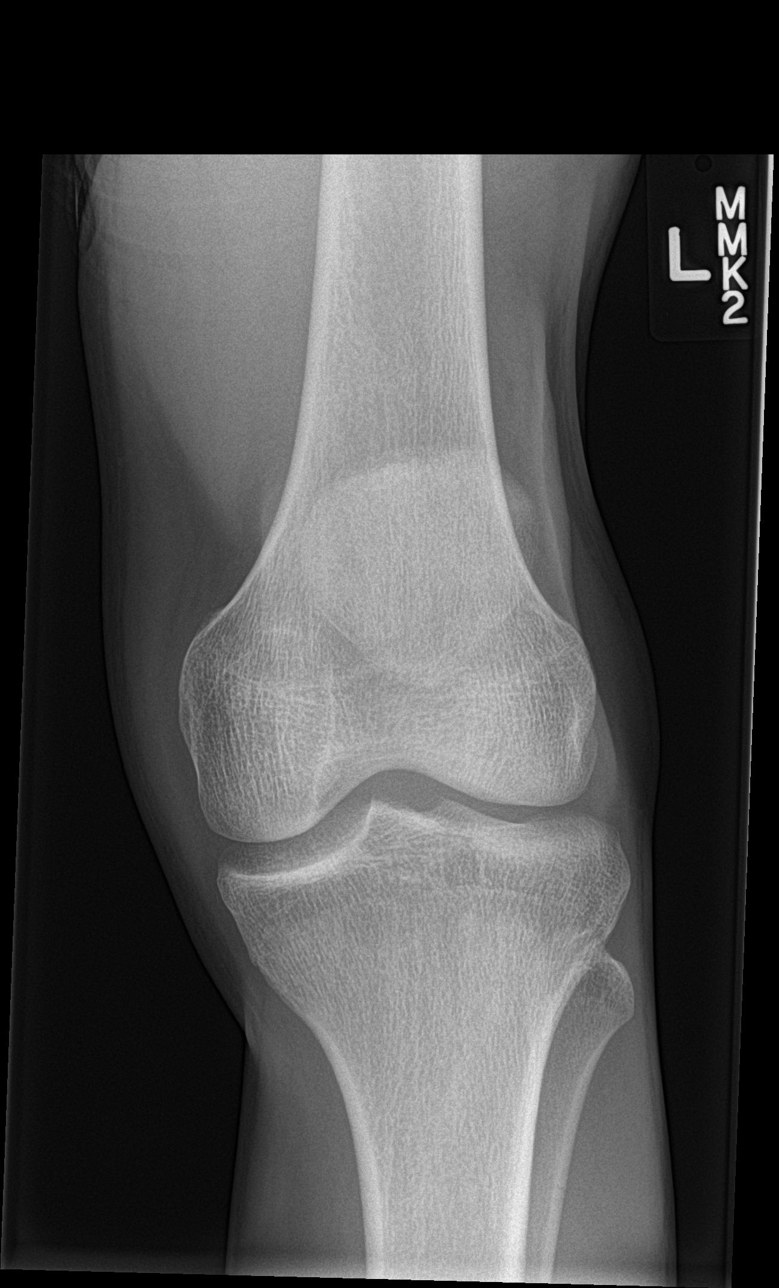

[tunnel]
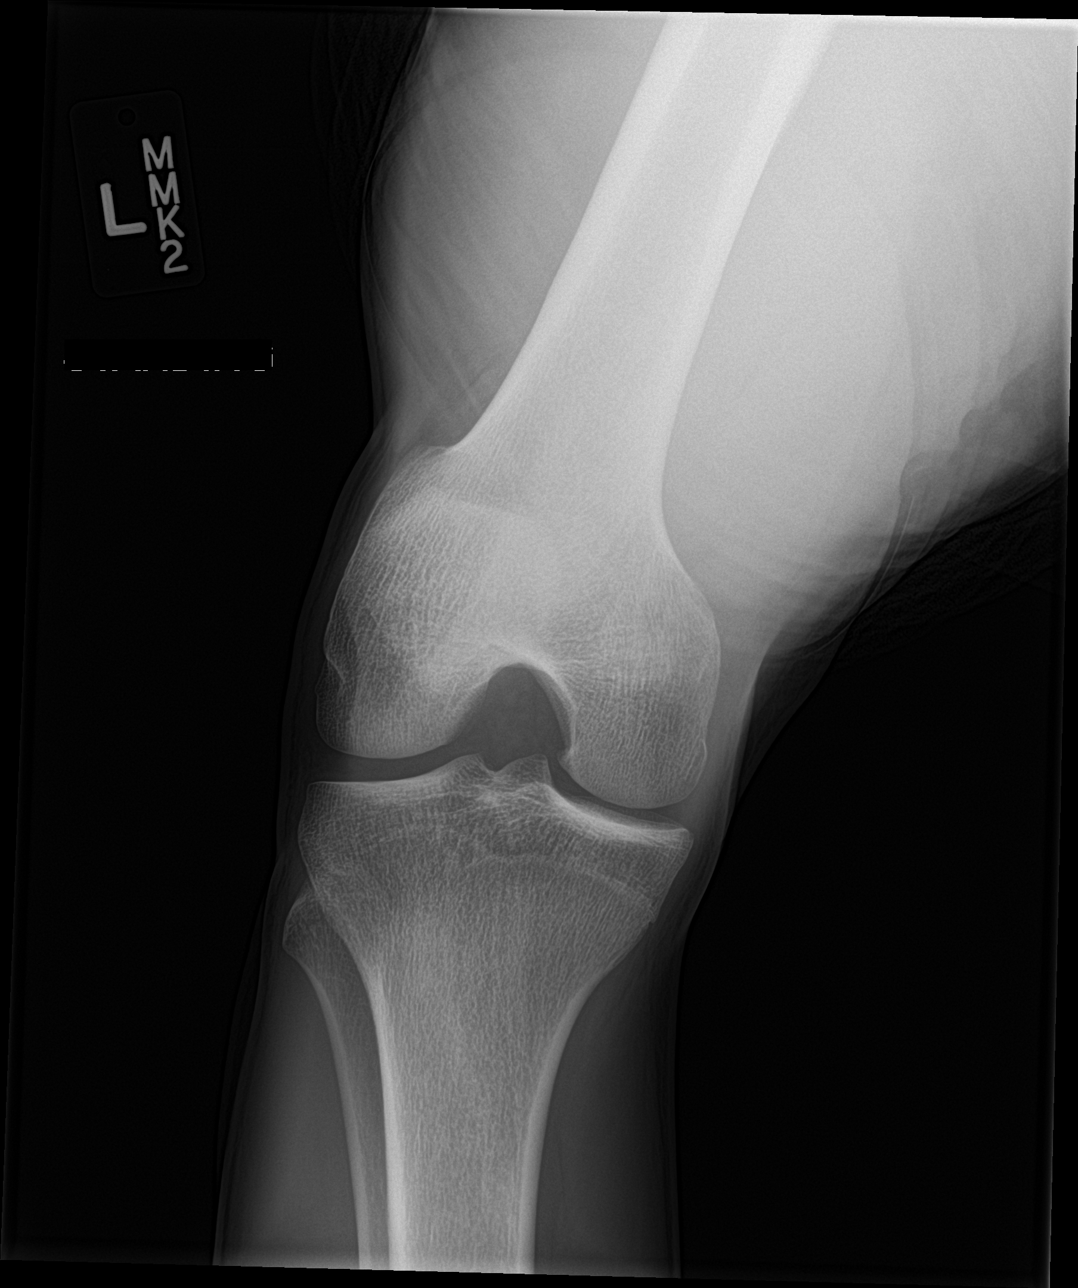

[knee lat]
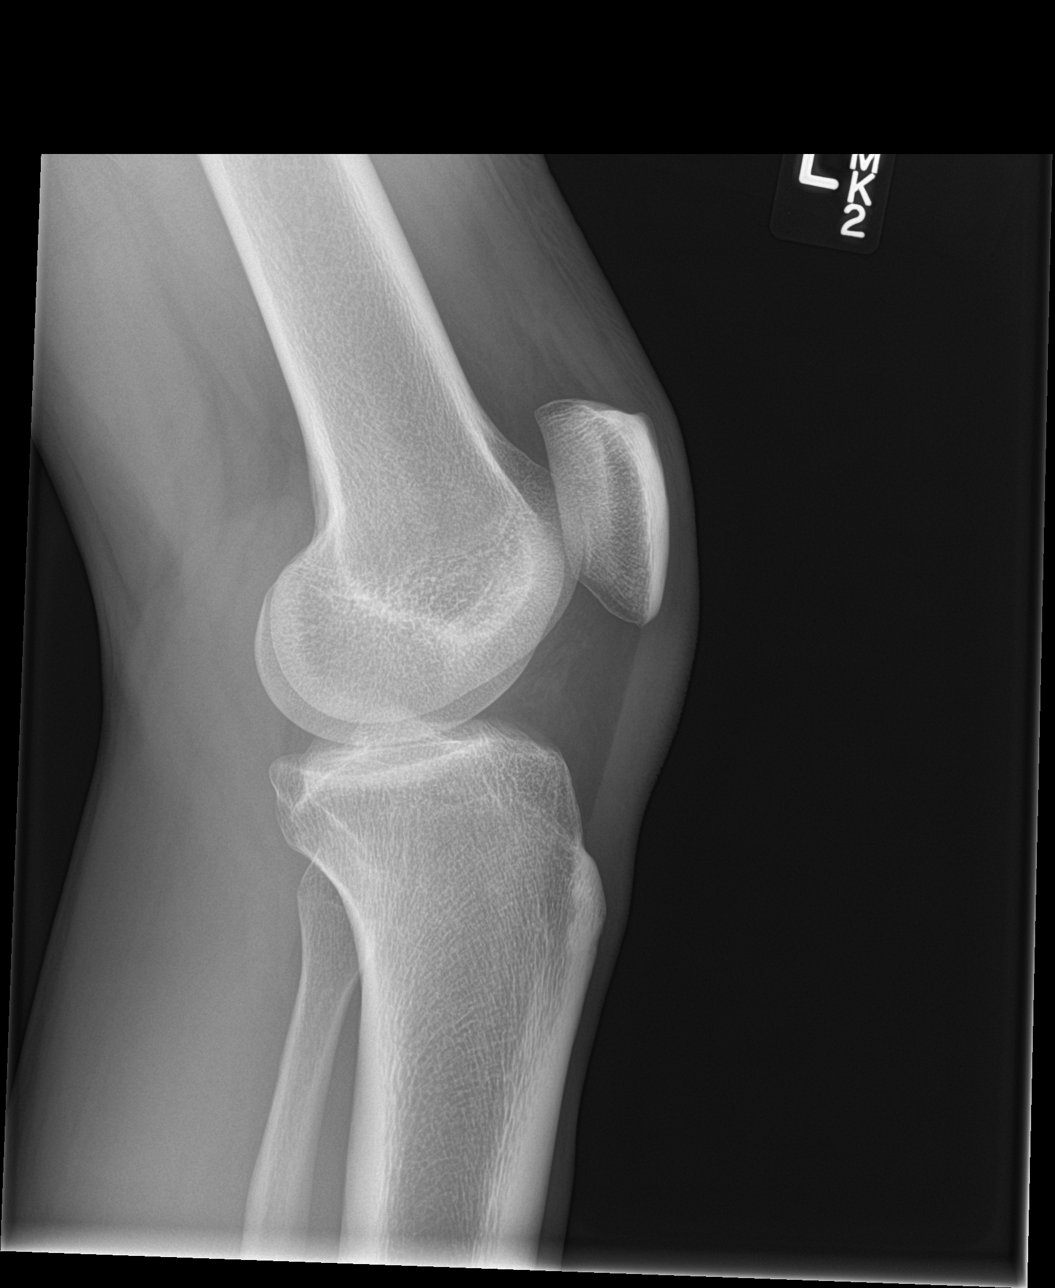

[knee sunrise]
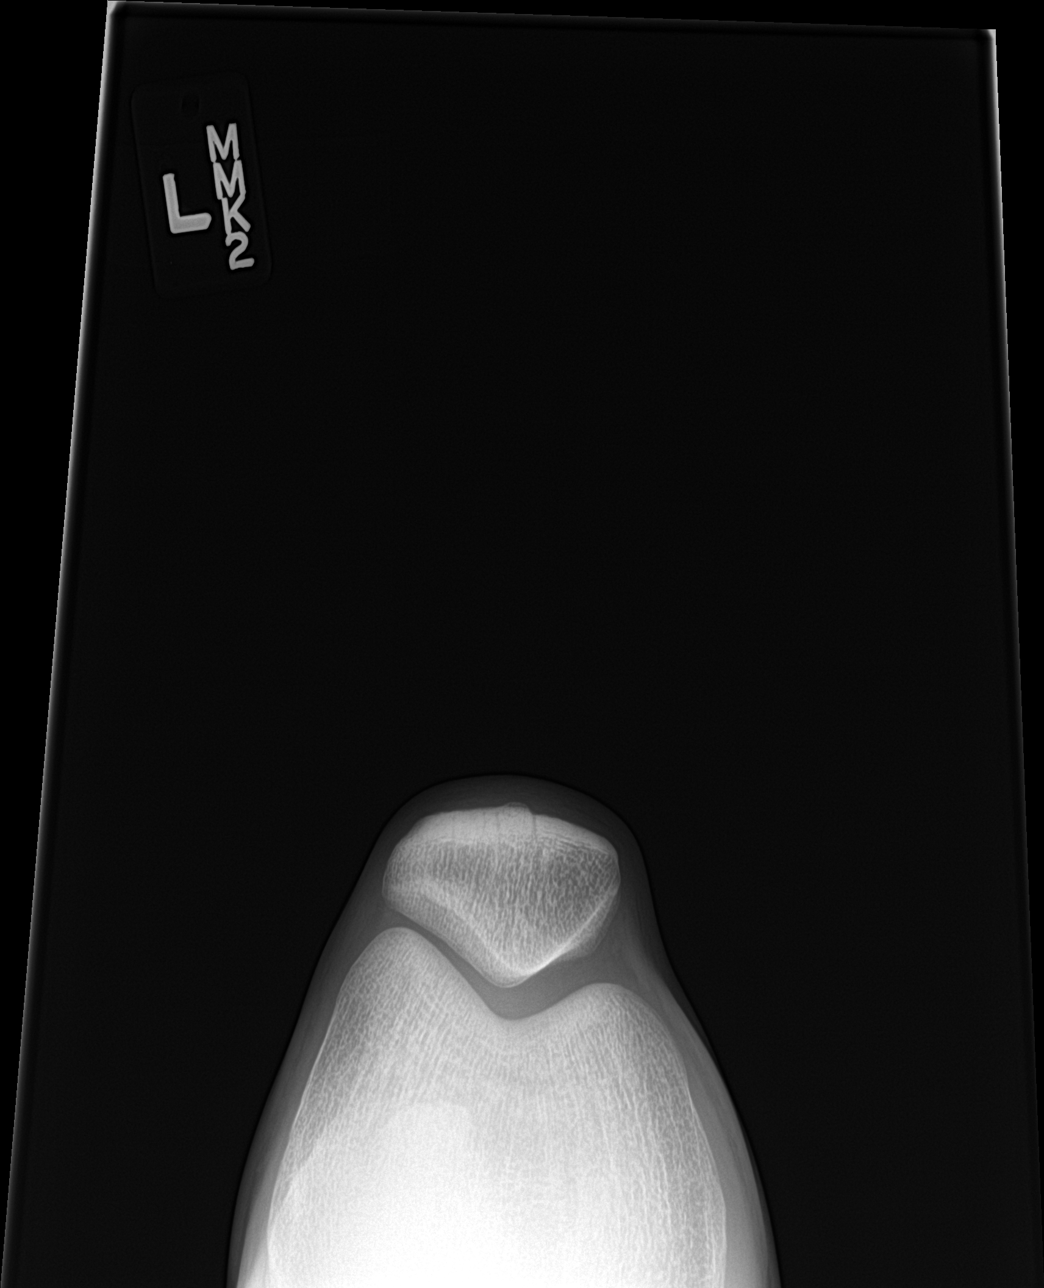

[4 of 4 positions shown; findings below may reference images not displayed]

FINDINGS: There is no evidence of fracture, dislocation, or joint effusion.
There is no evidence of arthropathy or other focal bone abnormality.
Soft tissues are unremarkable.
IMPRESSION: Negative.

## 2017-09-20 ENCOUNTER — Emergency Department (HOSPITAL_COMMUNITY)
Admission: EM | Admit: 2017-09-20 | Discharge: 2017-09-21 | Disposition: A | Discharge status: Other Acute Inpatient Hospital | Payer: BLUE CROSS/BLUE SHIELD | Attending: Emergency Medicine | Admitting: Emergency Medicine

## 2017-09-20 ENCOUNTER — Encounter (HOSPITAL_COMMUNITY): Payer: Self-pay | Admitting: Emergency Medicine

## 2017-09-20 ENCOUNTER — Emergency Department (HOSPITAL_COMMUNITY): Payer: BLUE CROSS/BLUE SHIELD

## 2017-09-20 DIAGNOSIS — F12150 Cannabis abuse with psychotic disorder with delusions: Secondary | ICD-10-CM

## 2017-09-20 DIAGNOSIS — M791 Myalgia, unspecified site: Secondary | ICD-10-CM | POA: Insufficient documentation

## 2017-09-20 DIAGNOSIS — R462 Strange and inexplicable behavior: Secondary | ICD-10-CM | POA: Diagnosis present

## 2017-09-20 DIAGNOSIS — Z046 Encounter for general psychiatric examination, requested by authority: Secondary | ICD-10-CM | POA: Diagnosis not present

## 2017-09-20 DIAGNOSIS — F17210 Nicotine dependence, cigarettes, uncomplicated: Secondary | ICD-10-CM | POA: Diagnosis not present

## 2017-09-20 DIAGNOSIS — F17200 Nicotine dependence, unspecified, uncomplicated: Secondary | ICD-10-CM | POA: Insufficient documentation

## 2017-09-20 DIAGNOSIS — R450 Nervousness: Secondary | ICD-10-CM | POA: Diagnosis not present

## 2017-09-20 DIAGNOSIS — F29 Unspecified psychosis not due to a substance or known physiological condition: Secondary | ICD-10-CM | POA: Diagnosis not present

## 2017-09-20 LAB — COMPREHENSIVE METABOLIC PANEL
ALT: 24 U/L (ref 17–63)
AST: 32 U/L (ref 15–41)
Albumin: 4.6 g/dL (ref 3.5–5.0)
Alkaline Phosphatase: 50 U/L (ref 38–126)
Anion gap: 9 (ref 5–15)
BUN: 16 mg/dL (ref 6–20)
CHLORIDE: 102 mmol/L (ref 101–111)
CO2: 26 mmol/L (ref 22–32)
Calcium: 9.3 mg/dL (ref 8.9–10.3)
Creatinine, Ser: 0.99 mg/dL (ref 0.61–1.24)
GFR calc Af Amer: >60 mL/min (ref >60)
GFR calc non Af Amer: >60 mL/min (ref >60)
Glucose, Bld: 78 mg/dL (ref 65–99)
Potassium: 4.5 mmol/L (ref 3.5–5.1)
Sodium: 137 mmol/L (ref 135–145)
Total Bilirubin: 1.6 mg/dL — ABNORMAL HIGH (ref 0.3–1.2)
Total Protein: 7.3 g/dL (ref 6.5–8.1)

## 2017-09-20 LAB — CBC WITH DIFFERENTIAL/PLATELET
Basophils Absolute: 0 10*3/uL (ref 0.0–0.1)
Basophils Relative: 1 %
EOS PCT: 0 %
Eosinophils Absolute: 0 10*3/uL (ref 0.0–0.7)
HCT: 41.7 % (ref 39.0–52.0)
Hemoglobin: 14.6 g/dL (ref 13.0–17.0)
LYMPHS ABS: 2 10*3/uL (ref 0.7–4.0)
LYMPHS PCT: 41 %
MCH: 31.7 pg (ref 26.0–34.0)
MCHC: 35 g/dL (ref 30.0–36.0)
MCV: 90.5 fL (ref 78.0–100.0)
MONO ABS: 0.4 10*3/uL (ref 0.1–1.0)
Monocytes Relative: 7 %
Neutro Abs: 2.5 10*3/uL (ref 1.7–7.7)
Neutrophils Relative %: 51 %
PLATELETS: 248 10*3/uL (ref 150–400)
RBC: 4.61 MIL/uL (ref 4.22–5.81)
RDW: 12.3 % (ref 11.5–15.5)
WBC: 4.9 10*3/uL (ref 4.0–10.5)

## 2017-09-20 LAB — ETHANOL: Alcohol, Ethyl (B): <10 mg/dL (ref <10)

## 2017-09-20 NOTE — ED Notes (Signed)
Bed: WA27 Expected date:  Expected time:  Means of arrival:  Comments: EMS-behavioral issue

## 2017-09-20 NOTE — ED Notes (Signed)
Pt now communicating with this nurse by writing on paper.
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
## 2017-09-20 NOTE — ED Notes (Signed)
Patient seen in his room awake singing. Patient stopped singing when this writer came in to his room. Patient did not respond to this Clinical research associatewriter or answer any of the questions asked him. Patient is left alone at this time. Will continue to monitor patient.

## 2017-09-20 NOTE — ED Notes (Signed)
PATIENT REFUSED TO HAVE BLOOD COLLECTIONS TO BE COLLECT BY SAKEING HIS HEAD NO

## 2017-09-20 NOTE — ED Notes (Signed)
Patient tried to walk out of unit.  PA notified and Dr. Freida BusmanAllen came over to talk to patient.  Patient was informed that he was going to be IVC'd.  Resting in bed now.  Transfer to [facility].  Patient refused to have his temperature taken.

## 2017-09-20 NOTE — ED Notes (Signed)
Patient's backpacks and coats placed in locker #27.

## 2017-09-20 NOTE — ED Notes (Signed)
[facility] Expected date:  Expected time:  Means of arrival:  Comments: [id number]

## 2017-09-20 NOTE — ED Notes (Signed)
Patient not changing into scrubs or allowing blood draws.

## 2017-09-20 NOTE — ED Provider Notes (Signed)
[facility]-EMERGENCY DEPT Provider Note   CSN: [id number] Arrival date & time: 09/20/17  1339     History   Chief Complaint Chief Complaint  Patient presents with  . Medical Clearance    HPI Jonathan Curry is a 26 y.o. male w/ no document pmh brought to ED by EMS for odd behavior. HPI obtained from triage nurse and triage note. Patient not speaking. Reportedly, EMS was called bc patient not responding verbally to questions. He refused blood work from [employer]. History limited, pt not speaking.   HPI  Past Medical History:  Diagnosis Date  . Sleep apnea     There are no active problems to display for this patient.   History reviewed. No pertinent surgical history.     Home Medications    Prior to Admission medications   Medication Sig Start Date End Date Taking? Authorizing Provider  amoxicillin (AMOXIL) 500 MG capsule Take 1 capsule (500 mg total) by mouth 3 (three) times daily. Patient not taking: Reported on 01/18/2015 10/05/14   Geoffery Lyonselo, Douglas, MD  HYDROcodone-acetaminophen (NORCO) 5-325 MG per tablet Take 1 tablet by mouth every 6 (six) hours as needed for severe pain. 06/09/15   Street, VoloMercedes, PA-C  naproxen (NAPROSYN) 500 MG tablet Take 1 tablet (500 mg total) by mouth 2 (two) times daily as needed for mild pain, moderate pain or headache (TAKE WITH MEALS.). 06/09/15   Street, ChoteauMercedes, PA-C  ondansetron (ZOFRAN ODT) 4 MG disintegrating tablet 4mg  ODT q4 hours prn nausea/vomit 01/18/15   Blane OharaZavitz, Joshua, MD  traMADol (ULTRAM) 50 MG tablet Take 1 tablet (50 mg total) by mouth every 6 (six) hours as needed. Patient not taking: Reported on 01/18/2015 04/26/14   Cy BlamerPalumbo, April, MD    Family History No family history on file.  Social History Social History   Tobacco Use  . Smoking status: Current Every Day Smoker  Substance Use Topics  . Alcohol use: Yes  . Drug use: Not on file     Allergies   Patient has no known allergies.   Review of  Systems Review of Systems  Psychiatric/Behavioral: Positive for behavioral problems.  All other systems reviewed and are negative.    Physical Exam Updated Vital Signs BP (!) 118/59 (BP Location: Right Arm)   Pulse (!) 56   SpO2 99%   Physical Exam  Constitutional: He is oriented to person, place, and time. He appears well-developed and well-nourished. No distress.  Non toxic  HENT:  Head: Normocephalic and atraumatic.  Right Ear: External ear normal.  Left Ear: External ear normal.  Nose: Nose normal.  No obvious sign of facial or scalp injury. Cannot examine mouth, refusing to cooperate with exam  Eyes: Conjunctivae are normal. No scleral icterus.  PERRL   Neck: Normal range of motion. Neck supple.  No cervical adenopathy  Cardiovascular: Normal rate, regular rhythm, normal heart sounds and intact distal pulses.  No murmur heard. 2+ DP and radial pulses bilaterally   Pulmonary/Chest: Effort normal and breath sounds normal. He has no wheezes.  Abdominal: Soft. There is no tenderness.  Musculoskeletal: Normal range of motion. He exhibits tenderness. He exhibits no deformity.  Passive, slow ROM of bilateral hip caused face grimace.  PROM of upper extremities w/o grimace  Neurological: He is alert and oriented to person, place, and time.  Unable to perform. No obvious facial droop.   Skin: Skin is warm and dry. Capillary refill takes less than 2 seconds.  Psychiatric: He has a  normal mood and affect. His behavior is normal. Judgment and thought content normal.  Nursing note and vitals reviewed.    ED Treatments / Results  Labs (all labs ordered are listed, but only abnormal results are displayed) Labs Reviewed  COMPREHENSIVE METABOLIC PANEL  ETHANOL  RAPID URINE DRUG SCREEN, HOSP PERFORMED  CBC WITH DIFFERENTIAL/PLATELET    EKG  EKG Interpretation None       Radiology No results found.  Procedures Procedures (including critical care time)  Medications  Ordered in ED Medications - No data to display   Initial Impression / Assessment and Plan / ED Course  I have reviewed the triage vital signs and the nursing notes.  Pertinent labs & imaging results that were available during my care of the patient were reviewed by me and considered in my medical decision making (see chart for details).  Clinical Course as of Sep 20 1626  Thu Sep 20, 2017  1504 Collected blood   [CG]    Clinical Course User Index [CG] Liberty HandyGibbons, Vinita Prentiss J, PA-C   26 year old male with no significant documented past medical history brought to the ED for odd behavior . History and exam is limited as patient is not speaking. Initially passive, slow range of motion of hips caused face grimace. Unable to assess the patient is in pain, suicidal ideation, homicidal lesion, hallucination. He refuses to follow commands during exam. He does however look at me when asked to do so. Reportedly refused initial lab work.  I obtained lab work and patient cooperated with this. Upon second evaluation I again attempted a better history and exam. At this time patient was found sitting on the bed. He kept making hand motions what I thought meant he wanted a piece of paper/pen. Provided him a pen, pencil and paper and he pushed away. Inappropriately grinning.Difficult situation as patient is uncooperative. He does not have any documented psychiatric visits in the ED. He appears to be understanding my questions, occasionally smiling and trying to communicate with me. However also inappropriately grinning, making hand motions and may be responding to internal stimluli. Physical exam is unremarkable. Vital signs are within normal limits. Narcotic database checked, ok.  Final Clinical Impressions(s) / ED Diagnoses   Bizarre behavior. Not following commands appears to be responding to internal stimuli. Does not appear to be safe to self. Pt evaluated by Dr Freida BusmanAllen and myself, a third time. Will IVC at this  time. Spoke to patient about this and he nodded. Pt handed off to oncoming ED. Once medically cleared should obtain TTS consult.   Final diagnoses:  Bizarre behavior    ED Discharge Orders    None       Jerrell MylarGibbons, Nikolette Reindl J, PA-C 09/20/17 1627    Lorre NickAllen, Anthony, MD 09/20/17 [id number]

## 2017-09-20 NOTE — ED Notes (Signed)
Pt admitted to room #43. Pt refusing to get out of bed and ambulate to this unit. 2 person escort to room #43. Limited assessment d/t pt is not communicating with this nurse on approach at this time. Encouragement and support. Special checks q 15 mins in place for safety. Video monitoring in place. Will continue to monitor.

## 2017-09-20 NOTE — ED Notes (Signed)
Informed by previous nurse Aram Beechamynthia, pt refusing ordered DG Pelvis.

## 2017-09-20 NOTE — ED Notes (Signed)
Pt coloring in room, continues to refuse to talk to this nurse on approach, pt does nod head.

## 2017-09-20 NOTE — ED Triage Notes (Signed)
Patient from [employer] who was acting strangely and when EMS arrived he would not respond verbally to their questions.  Vital signs stable.  CBG-100   Patient already refusing to have his blood drawn when tech approached him.

## 2017-09-20 NOTE — ED Notes (Signed)
Labs obtained by PA and sent by nurse.

## 2017-09-21 ENCOUNTER — Other Ambulatory Visit: Payer: Self-pay

## 2017-09-21 ENCOUNTER — Encounter (HOSPITAL_COMMUNITY): Payer: Self-pay

## 2017-09-21 ENCOUNTER — Inpatient Hospital Stay (HOSPITAL_COMMUNITY)
Admission: AD | Admit: 2017-09-21 | Discharge: 2017-09-25 | DRG: 885 | Disposition: A | Discharge status: Home / Self Care | Payer: BLUE CROSS/BLUE SHIELD | Attending: Psychiatry | Admitting: Psychiatry

## 2017-09-21 DIAGNOSIS — F329 Major depressive disorder, single episode, unspecified: Secondary | ICD-10-CM | POA: Diagnosis present

## 2017-09-21 DIAGNOSIS — R45851 Suicidal ideations: Secondary | ICD-10-CM | POA: Diagnosis present

## 2017-09-21 DIAGNOSIS — F419 Anxiety disorder, unspecified: Secondary | ICD-10-CM

## 2017-09-21 DIAGNOSIS — R462 Strange and inexplicable behavior: Secondary | ICD-10-CM | POA: Diagnosis not present

## 2017-09-21 DIAGNOSIS — F12150 Cannabis abuse with psychotic disorder with delusions: Secondary | ICD-10-CM

## 2017-09-21 DIAGNOSIS — F940 Selective mutism: Secondary | ICD-10-CM | POA: Diagnosis present

## 2017-09-21 DIAGNOSIS — G4700 Insomnia, unspecified: Secondary | ICD-10-CM | POA: Diagnosis present

## 2017-09-21 DIAGNOSIS — F2081 Schizophreniform disorder: Secondary | ICD-10-CM | POA: Diagnosis present

## 2017-09-21 DIAGNOSIS — G4730 Sleep apnea, unspecified: Secondary | ICD-10-CM | POA: Diagnosis present

## 2017-09-21 DIAGNOSIS — F29 Unspecified psychosis not due to a substance or known physiological condition: Secondary | ICD-10-CM

## 2017-09-21 DIAGNOSIS — R443 Hallucinations, unspecified: Secondary | ICD-10-CM | POA: Diagnosis not present

## 2017-09-21 DIAGNOSIS — R450 Nervousness: Secondary | ICD-10-CM | POA: Diagnosis not present

## 2017-09-21 DIAGNOSIS — Z87891 Personal history of nicotine dependence: Secondary | ICD-10-CM | POA: Diagnosis not present

## 2017-09-21 DIAGNOSIS — Z590 Homelessness: Secondary | ICD-10-CM | POA: Diagnosis not present

## 2017-09-21 DIAGNOSIS — Z818 Family history of other mental and behavioral disorders: Secondary | ICD-10-CM | POA: Diagnosis not present

## 2017-09-21 DIAGNOSIS — F17210 Nicotine dependence, cigarettes, uncomplicated: Secondary | ICD-10-CM | POA: Diagnosis not present

## 2017-09-21 DIAGNOSIS — F1910 Other psychoactive substance abuse, uncomplicated: Secondary | ICD-10-CM | POA: Diagnosis not present

## 2017-09-21 LAB — RAPID URINE DRUG SCREEN, HOSP PERFORMED
AMPHETAMINES: NOT DETECTED
Barbiturates: NOT DETECTED
Benzodiazepines: NOT DETECTED
Cocaine: NOT DETECTED
Opiates: NOT DETECTED
Tetrahydrocannabinol: POSITIVE — AB

## 2017-09-21 MED ORDER — OLANZAPINE 5 MG PO TBDP
5.0000 mg | ORAL_TABLET | Freq: Once | ORAL | Status: AC
  Administered 2017-09-21: 5 mg via ORAL
  Filled 2017-09-21: qty 1

## 2017-09-21 MED ORDER — HYDROXYZINE HCL 25 MG PO TABS: 25.0000 mg | ORAL_TABLET | Freq: Three times a day (TID) | ORAL | Status: DC | PRN

## 2017-09-21 MED ORDER — HYDROXYZINE HCL 25 MG PO TABS
25.0000 mg | ORAL_TABLET | Freq: Three times a day (TID) | ORAL | Status: DC | PRN
  Administered 2017-09-22 – 2017-09-23 (×2): 25 mg via ORAL
  Filled 2017-09-21: qty 1

## 2017-09-21 MED ORDER — ACETAMINOPHEN 325 MG PO TABS: 650.0000 mg | ORAL_TABLET | Freq: Four times a day (QID) | ORAL | Status: DC | PRN

## 2017-09-21 MED ORDER — ALUM & MAG HYDROXIDE-SIMETH 200-200-20 MG/5ML PO SUSP: 30.0000 mL | ORAL | Status: DC | PRN

## 2017-09-21 MED ORDER — OLANZAPINE 5 MG PO TABS: 7.5000 mg | ORAL_TABLET | Freq: Every day | ORAL | Status: DC

## 2017-09-21 MED ORDER — OLANZAPINE 7.5 MG PO TABS
7.5000 mg | ORAL_TABLET | Freq: Every day | ORAL | Status: DC
  Administered 2017-09-22 – 2017-09-23 (×2): 7.5 mg via ORAL
  Filled 2017-09-21 (×4): qty 1

## 2017-09-21 MED ORDER — ENSURE ENLIVE PO LIQD
237.0000 mL | Freq: Two times a day (BID) | ORAL | Status: DC
  Administered 2017-09-24: 237 mL via ORAL

## 2017-09-21 MED ORDER — MAGNESIUM HYDROXIDE 400 MG/5ML PO SUSP: 30.0000 mL | Freq: Every day | ORAL | Status: DC | PRN
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
## 2017-09-21 NOTE — ED Notes (Signed)
[facility].

## 2017-09-21 NOTE — Tx Team (Signed)
Initial Treatment Plan 09/21/2017 5:14 PM Jonathan Curry [id number]    PATIENT STRESSORS: Educational concerns Financial difficulties Substance abuse   PATIENT STRENGTHS: General fund of knowledge Special hobby/interest Other: Art major-pencil drawing   PATIENT IDENTIFIED PROBLEMS: Psychosis/bizarre behavior  "I would like budgeting classes when I leave"  "My own place to stay"  "Sleep better"               DISCHARGE CRITERIA:  Improved stabilization in mood, thinking, and/or behavior Verbal commitment to aftercare and medication compliance  PRELIMINARY DISCHARGE PLAN: Outpatient therapy Medication management  PATIENT/FAMILY INVOLVEMENT: This treatment plan has been presented to and reviewed with Jonathan patient, Jonathan Curry.  Jonathan patient and family have been given Jonathan opportunity to ask questions and make suggestions.  Levin BaconHeather V Darice Vicario, RN 09/21/2017, 5:14 PM

## 2017-09-21 NOTE — BH Assessment (Signed)
[facility] Assessment Progress Note  Per Thedore MinsMojeed Akintayo, MD, this pt requires psychiatric hospitalization.  Malva LimesLinsey Strader, RN, [id number].  Pt presents under IVC, and [facility].  Pt's nurse, Carlisle BeersLuann, has been notified, and agrees to call report to [phone].  Pt is to be transported via [employer].   Doylene Canninghomas Keayra Graham, MA Triage Specialist [phone]

## 2017-09-21 NOTE — Progress Notes (Signed)
Did not attend group 

## 2017-09-21 NOTE — Progress Notes (Signed)
Jonathan Curry is a 26 year old male being admitted involuntarily from [facility].  He came to the ED unaccompanied.  He was acting bizarre and unable to communicate well.  He was reporting visions of God.  He denied SI/HI or A hallucinations.  He has history of sleep apnea.  He is diagnosed with Unspecified schizophrenia and other psychotic disorder. [facility], he reported he didn't know why he was here.  He denies SI/HI or A hallucinations.  He does report visions of family members that have died.  He was quiet but pleasant.  He reported using marijuana on a regular basis.  He was slow to respond and confused at times.  Oriented him to the unit.  Admission paperwork completed and signed.  Belongings searched and secured in locker # 58 and 59.  Skin assessment completed and no skin issues noted.  Q 15 minute checks initiated for safety.  We will monitor the progress towards his goals.

## 2017-09-22 DIAGNOSIS — G4700 Insomnia, unspecified: Secondary | ICD-10-CM

## 2017-09-22 DIAGNOSIS — F419 Anxiety disorder, unspecified: Secondary | ICD-10-CM

## 2017-09-22 DIAGNOSIS — F12150 Cannabis abuse with psychotic disorder with delusions: Secondary | ICD-10-CM

## 2017-09-22 DIAGNOSIS — R443 Hallucinations, unspecified: Secondary | ICD-10-CM

## 2017-09-22 DIAGNOSIS — F4010 Social phobia, unspecified: Secondary | ICD-10-CM

## 2017-09-22 DIAGNOSIS — R450 Nervousness: Secondary | ICD-10-CM

## 2017-09-22 DIAGNOSIS — F1910 Other psychoactive substance abuse, uncomplicated: Secondary | ICD-10-CM

## 2017-09-22 DIAGNOSIS — F29 Unspecified psychosis not due to a substance or known physiological condition: Secondary | ICD-10-CM

## 2017-09-22 DIAGNOSIS — F39 Unspecified mood [affective] disorder: Secondary | ICD-10-CM

## 2017-09-22 MED ORDER — TRAZODONE HCL 50 MG PO TABS
50.0000 mg | ORAL_TABLET | Freq: Every evening | ORAL | Status: DC | PRN
  Administered 2017-09-22 – 2017-09-25 (×4): 50 mg via ORAL
  Filled 2017-09-22 (×8): qty 1

## 2017-09-22 NOTE — Progress Notes (Signed)
Adult Psychoeducational Group Note  Date:  09/22/2017 Time:  9:50 PM  Group Topic/Focus:  Wrap-Up Group:   The focus of this group is to help patients review their daily goal of treatment and discuss progress on daily workbooks.  Participation Level:  Active  Participation Quality:  Appropriate  Affect:  Appropriate  Cognitive:  Appropriate  Insight: Appropriate  Engagement in Group:  Engaged  Modes of Intervention:  Discussion  Additional Comments:  Pt stated his goal for today was to focus on his treatment plan and to have a stress free day. Pt stated he accomplished his goal today and felt good about doing it. Pt rated his overall day a 8 out of 10. Pt stated his goal for tomorrow was to attend all groups and program.  Jonathan FurnaceChristopher  [name] 09/22/2017, 9:50 PM

## 2017-09-22 NOTE — Progress Notes (Signed)
Patient has been present in the milieu without participation. Approached nurse requesting a drawing table and art supplies. Refused groups but went to gym and meals. Bright affect inconsistent with interaction. Denies SI/HI. Continues with AVH.

## 2017-09-22 NOTE — [facility] Group Notes (Signed)
[facility] Group Notes: (Clinical Social Work)   09/22/2017      Type of Therapy:  Group Therapy   Participation Level:  Did Not Attend despite MHT prompting   Ambrose MantleMareida Grossman-Orr, LCSW 09/22/2017, 12:28 PM

## 2017-09-22 NOTE — Progress Notes (Signed)
D: Pt denies SI/HI/AVH. Pt is pleasant and cooperative. Pt very paranoid this evening. Pt stated " I have sleep apnea , I can't take pills". Pt was informed that he was complaining of having issues sleeping and what he would be taking this evening would help him sleep. Pt reluctantly stated he would give the medications a try. Pt seen interacting minimally on the unit.   A: Pt was offered support and encouragement. Pt was given scheduled medications. Pt was encourage to attend groups. Q 15 minute checks were done for safety.   R: Pt is taking medication.  safety maintained on unit.

## 2017-09-22 NOTE — Progress Notes (Addendum)
Pt has been in the bed all evening.  Writer did not speak with patient.  Margo AyeHall MHTs encouraged pt to make his needs known to staff when they found him awake.  When writer went to check on pt, he was asleep.  Pt appears to resting with no distress observed.  Writer chose not to wake pt as on of the reasons he is in the hospital is that he has not been able to sleep.  Safety is maintained with q15 minute checks.

## 2017-09-22 NOTE — Plan of Care (Signed)
Pt safe on the unit this evening, pt took his medications and pt stated he was not sleeping restful and wanted to get more sleep

## 2017-09-22 NOTE — H&P (Signed)
Psychiatric Admission Assessment Adult  Patient Identification: Jonathan Curry MRN:  [id number] Date of Evaluation:  09/22/2017 Chief Complaint:  UNSPECIFIED SCHIZOPHRENIA Principal Diagnosis: <principal problem not specified> Diagnosis:   Patient Active Problem List   Diagnosis Date Noted  . Schizophrenia spectrum disorder with psychotic disorder type not yet determined ([place]) [F29] 09/21/2017  . 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 Schizophreniform disorder ([place]) [F20.81] 09/21/2017   History of Present Illness: Per assessment note- Jonathan Curry is an 26 y.o. male, who presents involuntary and unaccompanied to [facility]. During the assessment pt was a poor historian. Clinician noted per chart pt did not engage, and communicating with staff by using nonverbal cues (nodding his head.) Clinician asked the pt, "what brought you to the hospital?" Pt looked away then reported, "tired, I don't sleep well." Clinician asked the pt if he was here because he does not sleep well? Pt reported, today, he when he was in the community and he had a vision that [place]. Pt denies, SI, HI, AVH, self-injurious behaviors and access to weapons.   Pt's IVC paperwork was initiated by the EDP. Per IVC paperwork: "Observed bizarre behaviors, inappropriate judgement and behaviors, does not follow verbal commends, not verbally answering questions, started into distance, inappropriately grinning, repeatedly incomprehensive hand notions, appears to be responding to internal stimuli, dangerous to self."   Pt denies abuse. Pt reported, using a small amount of drugs. Clinician asked the pt to clarify the drugs he used. Pt reported, "drugs." Pt's UDS is pending. Pt denies, being linked to OPT resources (medication management and/or counseling.) Pt denies, previous inpatient admissions.   On Evaluation: Jonathan Curry is awake, alert and oriented.  Patient seen resting in bed. Reports " I just need to get my medications straighten."  Unable to recall the last time he has taken medications. Denies previous inpatient stay. Chart reviewed patient started on Zyprexa. Per chart patient diagnosis  Schizophrenia.   Support, encouragement and reassurance was provided.   Associated Signs/Symptoms: Depression Symptoms:  depressed mood, difficulty concentrating, anxiety, (Hypo) Manic Symptoms:  Distractibility, Irritable Mood, Labiality of Mood, Anxiety Symptoms:  Social Anxiety, Psychotic Symptoms:  Paranoia, PTSD Symptoms: NA Total Time spent with patient: 20 minutes  Past Psychiatric History:   Is the patient at risk to self? Yes.    Has the patient been a risk to self in the past 6 months? Yes.    Has the patient been a risk to self within the distant past? Yes.    Is the patient a risk to others? Yes.    Has the patient been a risk to others in the past 6 months? No.  Has the patient been a risk to others within the distant past? No.   Prior Inpatient Therapy:   Prior Outpatient Therapy:    Alcohol Screening: 1. How often do you have a drink containing alcohol?: Monthly or less 2. How many drinks containing alcohol do you have on a typical day when you are drinking?: 1 or 2 3. How often do you have six or more drinks on one occasion?: Never AUDIT-C Score: 1 9. Have you or someone else been injured as a result of your drinking?: No 10. Has a relative or friend or a doctor or another health worker been concerned about your drinking or suggested you cut down?: No Alcohol Use Disorder Identification Test Final Score (AUDIT): 1 Intervention/Follow-up: AUDIT Score <7 follow-up not  indicated Substance Abuse History in the last 12 months:  Yes.   Consequences of Substance Abuse: NA Previous Psychotropic Medications: YES Psychological Evaluations: yes Past Medical History:  Past Medical History:  Diagnosis Date  . Sleep apnea     History reviewed. No pertinent surgical history. Family History: History reviewed. No pertinent family history. Family Psychiatric  History: patient reports is aunt has mental illness unsure of her diagnosis.  Tobacco Screening: Have you used any form of tobacco in the last 30 days? (Cigarettes, Smokeless Tobacco, Cigars, and/or Pipes): No Social History:  Social History   Substance and Sexual Activity  Alcohol Use Yes     Social History   Substance and Sexual Activity  Drug Use Yes  . Types: Marijuana    Additional Social History:                           Allergies:  No Known Allergies Lab Results:  Results for orders placed or performed during the hospital encounter of 09/20/17 (from the past 48 hour(s))  Comprehensive metabolic panel     Status: Abnormal   Collection Time: 09/20/17  3:02 PM  Result Value Ref Range   Sodium 137 135 - 145 mmol/L   Potassium 4.5 3.5 - 5.1 mmol/L   Chloride 102 101 - 111 mmol/L   CO2 26 22 - 32 mmol/L   Glucose, Bld 78 65 - 99 mg/dL   BUN 16 6 - 20 mg/dL   Creatinine, Ser 0.99 0.61 - 1.24 mg/dL   Calcium 9.3 8.9 - 10.3 mg/dL   Total Protein 7.3 6.5 - 8.1 g/dL   Albumin 4.6 3.5 - 5.0 g/dL   AST 32 15 - 41 U/L   ALT 24 17 - 63 U/L   Alkaline Phosphatase 50 38 - 126 U/L   Total Bilirubin 1.6 (H) 0.3 - 1.2 mg/dL   GFR calc non Af Amer >60 >60 mL/min   GFR calc Af Amer >60 >60 mL/min    Comment: (NOTE) The eGFR has been calculated using the CKD EPI equation. This calculation has not been validated in all clinical situations. eGFR's persistently <60 mL/min signify possible Chronic Kidney Disease.    Anion gap 9 5 - 15  Ethanol     Status: None   Collection Time: 09/20/17  3:02 PM  Result Value Ref Range   Alcohol, Ethyl (B) <10 <10 mg/dL    Comment:        LOWEST DETECTABLE LIMIT FOR SERUM ALCOHOL IS 10 mg/dL FOR MEDICAL PURPOSES ONLY   CBC with Diff     Status: None   Collection Time: 09/20/17  3:02 PM  Result Value  Ref Range   WBC 4.9 4.0 - 10.5 K/uL   RBC 4.61 4.22 - 5.81 MIL/uL   Hemoglobin 14.6 13.0 - 17.0 g/dL   HCT 41.7 39.0 - 52.0 %   MCV 90.5 78.0 - 100.0 fL   MCH 31.7 26.0 - 34.0 pg   MCHC 35.0 30.0 - 36.0 g/dL   RDW 12.3 11.5 - 15.5 %   Platelets 248 150 - 400 K/uL   Neutrophils Relative % 51 %   Neutro Abs 2.5 1.7 - 7.7 K/uL   Lymphocytes Relative 41 %   Lymphs Abs 2.0 0.7 - 4.0 K/uL   Monocytes Relative 7 %   Monocytes Absolute 0.4 0.1 - 1.0 K/uL   Eosinophils Relative 0 %   Eosinophils Absolute 0.0 0.0 - 0.7 K/uL  Basophils Relative 1 %   Basophils Absolute 0.0 0.0 - 0.1 K/uL  Urine rapid drug screen (hosp performed)     Status: Abnormal   Collection Time: 09/21/17  9:57 AM  Result Value Ref Range   Opiates NONE DETECTED NONE DETECTED   Cocaine NONE DETECTED NONE DETECTED   Benzodiazepines NONE DETECTED NONE DETECTED   Amphetamines NONE DETECTED NONE DETECTED   Tetrahydrocannabinol POSITIVE (A) NONE DETECTED   Barbiturates NONE DETECTED NONE DETECTED    Comment:        DRUG SCREEN FOR MEDICAL PURPOSES ONLY.  IF CONFIRMATION IS NEEDED FOR ANY PURPOSE, NOTIFY LAB WITHIN 5 DAYS.        LOWEST DETECTABLE LIMITS FOR URINE DRUG SCREEN Drug Class       Cutoff (ng/mL) Amphetamine      1000 Barbiturate      200 Benzodiazepine   099 Tricyclics       833 Opiates          300 Cocaine          300 THC              50     Blood Alcohol level:  Lab Results  Component Value Date   ETH <10 82/50/5397    Metabolic Disorder Labs:  No results found for: HGBA1C, MPG No results found for: PROLACTIN No results found for: CHOL, TRIG, HDL, CHOLHDL, VLDL, LDLCALC  Current Medications: Current Facility-Administered Medications  Medication Dose Route Frequency Provider Last Rate Last Dose  . acetaminophen (TYLENOL) tablet 650 mg  650 mg Oral Q6H PRN Patrecia Pour, NP      . alum & mag hydroxide-simeth (MAALOX/MYLANTA) 200-200-20 MG/5ML suspension 30 mL  30 mL Oral Q4H PRN Patrecia Pour, NP      . feeding supplement (ENSURE ENLIVE) (ENSURE ENLIVE) liquid 237 mL  237 mL Oral BID BM Pennelope Bracken, MD      . hydrOXYzine (ATARAX/VISTARIL) tablet 25 mg  25 mg Oral TID PRN Patrecia Pour, NP      . magnesium hydroxide (MILK OF MAGNESIA) suspension 30 mL  30 mL Oral Daily PRN Patrecia Pour, NP      . OLANZapine (ZYPREXA) tablet 7.5 mg  7.5 mg Oral QHS Patrecia Pour, NP       PTA Medications: No medications prior to admission.    Musculoskeletal: Strength & Muscle Tone: within normal limits Gait & Station: normal Patient leans: N/A  Psychiatric Specialty Exam: Physical Exam  Vitals reviewed. Constitutional: He appears well-developed.  Cardiovascular: Normal rate.  Neurological: He is alert.  Psychiatric: He has a normal mood and affect. His behavior is normal.    Review of Systems  Psychiatric/Behavioral: Positive for depression, hallucinations, substance abuse and suicidal ideas. The patient is nervous/anxious.     Blood pressure 126/76, pulse 62, temperature 97.7 F (36.5 C), temperature source Oral, resp. rate 18, height _0  (1.727 m), weight 51.3 kg (113 lb), SpO2 100 %.Body mass index is 17.18 kg/m.  General Appearance: Casual  Eye Contact:  Fair  Speech:  Pressured  Volume:   fluctuation   Mood:  Anxious, Dysphoric and Irritable  Affect:  Depressed and Flat  Thought Process:  Coherent  Orientation:  Full (Time, Place, and Person)  Thought Content:  Hallucinations: Auditory and Paranoid Ideation  Suicidal Thoughts:  No  Homicidal Thoughts:  No  Memory:  Immediate;   Poor Recent;   Poor Remote;   Poor patient  is a poor historian   Judgement:  Fair  Psychomotor Activity:  Restlessness  Concentration:  Concentration: Fair  Recall:  Poor  Fund of Knowledge:  Fair  Language:  Fair  Akathisia:  No  Handed:  Right  AIMS (if indicated):     Assets:  Communication Skills Resilience Social Support  ADL's:  Intact  Cognition:   WNL  Sleep:  Number of Hours: 5    Treatment Plan Summary: Daily contact with patient to assess and evaluate symptoms and progress in treatment and Medication management    Continue wit Zyprexa 7.34m for mood stabilization. Continue with Trazodone 50 mg for insomnia  Will continue to monitor vitals ,medication compliance and treatment side effects while patient is here.  Reviewed labs:,BAL -, UDS - pos thc  CSW will start working on disposition.  Patient to participate in therapeutic milieu    Observation Level/Precautions:  15 minute checks  Laboratory:  CBC Chemistry Profile HbAIC UDS UA  Psychotherapy:  Individual and group session  Medications:  See SRA by MD   Consultations:  CSW and Psychiatry   Discharge Concerns:  Safety, stabilization, and risk of access to medication and medication stabilization   Estimated LOS: 5-7days  Other:     Physician Treatment Plan for Primary Diagnosis: <principal problem not specified> Long Term Goal(s): Improvement in symptoms so as ready for discharge  Short Term Goals: Ability to demonstrate self-control will improve, Ability to maintain clinical measurements within normal limits will improve and Compliance with prescribed medications will improve  Physician Treatment Plan for Secondary Diagnosis: Active Problems:   Schizophreniform disorder ([place]  Long Term Goal(s): Improvement in symptoms so as ready for discharge  Short Term Goals: Ability to identify changes in lifestyle to reduce recurrence of condition will improve, Ability to verbalize feelings will improve and Ability to identify and develop effective coping behaviors will improve  I certify that inpatient services furnished can reasonably be expected to improve the patient's condition.    TDerrill Center NP 12/1/201810:10 AM

## 2017-09-22 NOTE — [facility] Suicide Risk Assessment (Signed)
[facility]   Nursing information obtained from:  Patient Demographic factors:  Male, Unemployed Current Mental Status:  NA Loss Factors:  Financial problems / change in socioeconomic status Historical Factors:  Family history of mental illness or substance abuse Risk Reduction Factors:  NA  Total Time spent with patient: 45 minutes Principal Problem:  Diagnosis:   Patient Active Problem List   Diagnosis Date Noted  . Schizophrenia spectrum disorder with psychotic disorder type not yet determined ([facility]) [F29] 09/21/2017  . 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 Schizophreniform disorder ([facility]) [F20.81] 09/21/2017    Continued Clinical Symptoms:  Alcohol Use Disorder Identification Test Final Score (AUDIT): 1 The "Alcohol Use Disorders Identification Test", Guidelines for Use in Primary Care, Second Edition.  World [employer] [facility] [facility](WHO). Score between 0-7:  no or low risk or alcohol related problems. Score between 8-15:  moderate risk of alcohol related problems. Score between 16-19:  high risk of alcohol related problems. Score 20 or above:  warrants further diagnostic evaluation for alcohol dependence and treatment.   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 Lives with friends. He reports he was in the [employer] , [place] discharge in 2014.  Patient is fair historian. Reports he is feeling better. As per chart notes, he was brought in via IVC due to bizarre behaviors, staring into the distance, being selectively mute, stereotyped hand movements, inappropriate affect , and appearing to be internally preoccupied . In ED Patient acknowledges he was not communicating and " not myself ", which he attributes to having been tired and " stressed ". In ED presented with odd behaviors, selectively mute initially and reporting he had visions of [place]. Today he minimizes , states " I guess it was a dream". Denies alcohol abuse, endorses occasional cannabis use- admission UDS positive for cannabis , otherwise negative, BAL <10. At this time states he is feeling well , denies depression, does not endorse neuro-vegetative symptoms of depression  Patient denies prior psychiatric history and denies prior psychiatric admissions. Denies history of severe depression, denies history of suicide attempts, denies history of self cutting, denies history of violence. Denies history of PTSD. Denies history of psychosis.   Denies family psychiatric history other than an aunt having history of depression.  Denies medical illnesses, but states he has been told he has Sleep Apnea in the past  . NKDA.   He was not taking medications prior to admission.  Dx- Psychosis - Unspecified, consider Substance Induced Psychosis  Plan- Inpatient admission. Has been started on Zyprexa 7.5 mgrs QHS.          Musculoskeletal: Strength & Muscle Tone: within normal limits Gait & Station: normal Patient leans: N/A  Psychiatric Specialty Exam: Physical Exam  ROS denies headache, no chest pain, no shortness of breath, no vomiting  Blood pressure 126/76, pulse 62, temperature 97.7 F (36.5 C), temperature source Oral, resp. rate 18, height 5\' 8"  (1.727 m), weight 51.3 kg (113 lb), SpO2 100 %.Body mass index is 17.18 kg/m.  General Appearance: Fairly Groomed  Eye Contact:  Good  Speech:  Normal Rate  Volume:  Normal  Mood:  reports he is feeling "OK", denies feeling depressed  Affect:  Appropriate and smiles at times appropriately  Thought Process:  Linear and Descriptions of Associations: Intact- becomes slightly tangential with open ended questions  Orientation:  Full (Time, Place, and Person)  Thought Content:  denies hallucinations , no delusions expressed , does not currently appear internally preoccupied   Suicidal Thoughts:  No denies  current suicidal or self injurious ideations, denies homicidal or violent ideations, contracts for safety on unit   Homicidal Thoughts:  No  Memory:  recent and remote grossly intact   Judgement:  Fair  Insight:  Fair  Psychomotor Activity:  Normal- no psychomotor agitation. No abnormal posturing noted   Concentration:  Concentration: Fair and Attention Span: Fair  Recall:  Good  Fund of Knowledge:  Good  Language:  Good  Akathisia:  Negative  Handed:  Right  AIMS (if indicated):     Assets:  Communication Skills Desire for Improvement Physical Health Resilience  ADL's:  Intact  Cognition:  WNL  Sleep:  Number of Hours: 5      COGNITIVE FEATURES THAT CONTRIBUTE TO RISK:  Closed-mindedness and Loss of executive function    SUICIDE RISK:   Moderate:  Frequent suicidal ideation with limited intensity, and duration, some specificity in terms of plans, no associated intent, good self-control, limited dysphoria/symptomatology, some risk factors present, and identifiable protective factors, including available and accessible social support.  PLAN OF CARE: Patient will be admitted to inpatient psychiatric unit for stabilization and safety. Will provide and encourage milieu participation. Provide medication management and maked adjustments as needed.  Will follow daily.    I certify that inpatient services furnished can reasonably be expected to improve the patient's condition.   Craige CottaFernando A Cobos, MD 09/22/2017, 4:06 PM

## 2017-09-23 DIAGNOSIS — F2081 Schizophreniform disorder: Principal | ICD-10-CM

## 2017-09-23 DIAGNOSIS — Z87891 Personal history of nicotine dependence: Secondary | ICD-10-CM

## 2017-09-23 LAB — LIPID PANEL
CHOL/HDL RATIO: 2.5 ratio
CHOLESTEROL: 130 mg/dL (ref 0–200)
HDL: 53 mg/dL (ref >40)
LDL Cholesterol: 64 mg/dL (ref 0–99)
Triglycerides: 64 mg/dL (ref <150)
VLDL: 13 mg/dL (ref 0–40)

## 2017-09-23 LAB — TSH: TSH: 2.692 u[IU]/mL (ref 0.350–4.500)

## 2017-09-23 MED ORDER — OLANZAPINE 2.5 MG PO TABS
2.5000 mg | ORAL_TABLET | Freq: Every day | ORAL | Status: DC
  Administered 2017-09-23 – 2017-09-24 (×2): 2.5 mg via ORAL
  Filled 2017-09-23 (×4): qty 1

## 2017-09-23 MED ORDER — BENZTROPINE MESYLATE 0.5 MG PO TABS
0.5000 mg | ORAL_TABLET | Freq: Two times a day (BID) | ORAL | Status: DC
  Administered 2017-09-23 – 2017-09-25 (×4): 0.5 mg via ORAL
  Filled 2017-09-23 (×7): qty 1

## 2017-09-23 NOTE — Progress Notes (Signed)
D: Pt denies SI/HI , passive AVH. Pt is pleasant and cooperative. Pt stated he got the best sleep in a long time last night. Pt appeared to be feeling a lot better, pt did no seem as suspicious as last night when taking his medications.   A: Pt was offered support and encouragement. Pt was given scheduled medications. Pt was encourage to attend groups. Q 15 minute checks were done for safety.   R:Pt is taking medication. Pt has no complaints.Pt receptive to treatment and safety maintained on unit.

## 2017-09-23 NOTE — Progress Notes (Signed)
[facility]  09/23/2017 1:35 PM Jonathan Curry  MRN:  [id number] Subjective: [name] reports " I am doing okay, I guess."    Objective: Jonathan Curry is awake, alert report " feeling okay today" thought blocking noted with responses. Patient denies a denies auditory or visual hallucinations however appears to be responding to internal stimuli.  Patient continues to isolate to room. Reports taken medication as prescribed and states he is tolerating medications well. Patient seen attending group session. Patient reports he is medication compliant without mediation side effects. Reports good appetite and states he is resting well.Support, encouragement and reassurance was provided.    Principal Problem: Psychosis (HCC) Diagnosis:   Patient Active Problem List   Diagnosis Date Noted  . Psychosis (HCC) [F29] 09/21/2017  . 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 Sleep apnea    History reviewed. No pertinent surgical history. Family History: History reviewed. No pertinent family history. Family Psychiatric  History:  Social History:  Social History   Substance and Sexual Activity  Alcohol Use Yes     Social History   Substance and Sexual Activity  Drug Use Yes  . Types: Marijuana    Social History   Socioeconomic History  . Marital status: Single    Spouse name: None  . Number of children: None  . Years of education: None  . Highest education level: None  Social Needs  . Financial resource strain: None  . Food insecurity - worry: None  . Food insecurity - inability: None  . Transportation needs - medical: None  . Transportation needs - non-medical: None  Occupational History  . None  Tobacco Use  . Smoking status: Former Games developermoker   . Smokeless tobacco: Never Used  Substance and Sexual Activity  . Alcohol use: Yes  . Drug use: Yes    Types: Marijuana  . Sexual activity: None  Other Topics Concern  . None  Social History Narrative  . None   Additional Social History:                         Sleep: Fair  Appetite:  Fair  Current Medications: Current Facility-Administered Medications  Medication Dose Route Frequency Provider Last Rate Last Dose  . acetaminophen (TYLENOL) tablet 650 mg  650 mg Oral Q6H PRN Charm RingsLord, Jamison Y, NP      . alum & mag hydroxide-simeth (MAALOX/MYLANTA) 200-200-20 MG/5ML suspension 30 mL  30 mL Oral Q4H PRN Charm RingsLord, Jamison Y, NP      . benztropine (COGENTIN) tablet 0.5 mg  0.5 mg Oral BID Oneta RackLewis, Tanika N, NP      . feeding supplement (ENSURE ENLIVE) (ENSURE ENLIVE) liquid 237 mL  237 mL Oral BID BM Micheal Likensainville, [name] T, MD      . hydrOXYzine (ATARAX/VISTARIL) tablet 25 mg  25 mg Oral TID PRN Charm RingsLord, Jamison Y, NP   25 mg at 09/22/17 2133  . magnesium hydroxide (MILK OF MAGNESIA) suspension 30 mL  30 mL Oral Daily PRN Charm RingsLord, Jamison Y, NP      . OLANZapine (ZYPREXA) tablet 2.5 mg  2.5 mg Oral Daily Oneta RackLewis, Tanika N, NP      . OLANZapine (ZYPREXA) tablet 7.5 mg  7.5 mg Oral QHS Charm RingsLord, Jamison Y, NP  7.5 mg at 09/22/17 2131  . traZODone (DESYREL) tablet 50 mg  50 mg Oral QHS,MR X 1 Oneta Rack, NP   50 mg at 09/22/17 2132    Lab Results:  Results for orders placed or performed during the hospital encounter of 09/21/17 (from the past 48 hour(s))  TSH     Status: None   Collection Time: 09/23/17  6:30 AM  Result Value Ref Range   TSH 2.692 0.350 - 4.500 uIU/mL    Comment: Performed by a 3rd Generation assay with a functional sensitivity of <=0.01 uIU/mL. Performed at [facility], [address], [place], [place] [address]     Blood Alcohol level:  Lab Results  Component Value Date   ETH <10 09/20/2017    Metabolic Disorder Labs: No results found  for: HGBA1C, MPG No results found for: PROLACTIN No results found for: CHOL, TRIG, HDL, CHOLHDL, VLDL, LDLCALC  Physical Findings: AIMS: Facial and Oral Movements Muscles of Facial Expression: None, normal Lips and Perioral Area: None, normal Jaw: None, normal Tongue: None, normal,Extremity Movements Upper (arms, wrists, hands, fingers): None, normal Lower (legs, knees, ankles, toes): None, normal, Trunk Movements Neck, shoulders, hips: None, normal, Overall Severity Severity of abnormal movements (highest score from questions above): None, normal Incapacitation due to abnormal movements: None, normal Patient's awareness of abnormal movements (rate only patient's report): No Awareness, Dental Status Current problems with teeth and/or dentures?: No Does patient usually wear dentures?: No  CIWA:    COWS:     Musculoskeletal: Strength & Muscle Tone: within normal limits Gait & Station: normal Patient leans: N/A  Psychiatric Specialty Exam: Physical Exam  Vitals reviewed. Constitutional: He is oriented to person, place, and time. He appears well-developed.  Cardiovascular: Normal rate.  Neurological: He is alert and oriented to person, place, and time.  Psychiatric: He has a normal mood and affect. His behavior is normal.    Review of Systems  Psychiatric/Behavioral: Positive for depression and hallucinations. The patient is nervous/anxious.     Blood pressure 121/81, pulse 62, temperature (!) 97.4 F (36.3 C), temperature source Oral, resp. rate 16, height 5\' 8"  (1.727 m), weight 51.3 kg (113 lb), SpO2 100 %.Body mass index is 17.18 kg/m.  General Appearance: Casual  Eye Contact:  Good  Speech:  Clear and Coherent  Volume:  Normal  Mood:  Dysphoric  Affect:  Depressed and Flat  Thought Process:  Linear and Descriptions of Associations: Circumstantial  Orientation:  Full (Time, Place, and Person)  Thought Content:  Hallucinations: Auditory internally preoccupied    Suicidal Thoughts:  No  Homicidal Thoughts:  No  Memory:  Immediate;   Fair Recent;   Fair Remote;   Fair  Judgement:  Intact  Insight:  Lacking  Psychomotor Activity:  Restlessness  Concentration:  Concentration: Poor  Recall:  [employer] of Knowledge:  Fair  Language:  Good  Akathisia:  No  Handed:  Right  AIMS (if indicated):     Assets:  Communication Skills Desire for Improvement Resilience Social Support  ADL's:  Intact  Cognition:  WNL  Sleep:  Number of Hours: 6.75     Treatment Plan Summary: Daily contact with patient to assess and evaluate symptoms and progress in treatment and Medication management  Increased  Zyprexa  To 2.5 mg Q daily and 7.5mg  QHS for mood stabilization. Initiate Cogentin  0.50mg  PO BID for EPS  Continue with Trazodone 50 mg for insomnia - EKG - pending?  Will continue to monitor vitals ,medication compliance and treatment side effects while patient is here.  Reviewed labs:,BAL -, UDS - pos thc  CSW will start working on disposition.  Patient to participate in therapeutic milieu    Oneta Rackanika N Lewis, NP 09/23/2017, 1:35 PM

## 2017-09-23 NOTE — Progress Notes (Signed)
Patient asked if he could request to stay a few more days. Confused and appeared to be responding in the AM, stated that he was still hearing some voices but very guarded in disclosing. Brighter affect in the afternoon, attended groups and recreation, showered and shaved and appeared cheerful. Stated that he feels it is helping him to be here and he does not want to leave before he gets well. Motivated for continued treatment.

## 2017-09-23 NOTE — Progress Notes (Signed)
NUTRITION ASSESSMENT  Pt identified as at risk on the Malnutrition Screen Tool  INTERVENTION: Ensure Enlive po BID, each supplement provides 350 kcal and 20 grams of protein  NUTRITION DIAGNOSIS: Unintentional weight loss related to sub-optimal intake as evidenced by pt report.   Goal: Pt to meet >/= 90% of their estimated nutrition needs.  Monitor:  PO intake  Assessment:  Pt with PMH of sleep apnea presents with schizophrenifrom disorder  Per chart review, pt refused groups but went to gym and meals.  No recent weights available per weight history. Last weight recorded of 127 lbs in 2016. Based on underweight status will provide nutritional supplement BID to promote adequate calorie and protein consumption.  Height: Ht Readings from Last 1 Encounters:  09/21/17 5\' 8"  (1.727 m)    Weight: Wt Readings from Last 1 Encounters:  09/21/17 113 lb (51.3 kg)    Weight Hx: Wt Readings from Last 10 Encounters:  09/21/17 113 lb (51.3 kg)  06/08/15 127 lb 5 oz (57.7 kg)  10/05/14 131 lb (59.4 kg)  04/26/14 133 lb 2 oz (60.4 kg)    BMI:  Body mass index is 17.18 kg/m. Pt meets criteria for underweight based on current BMI.  Estimated Nutritional Needs: Kcal: 25-30 kcal/kg Protein: > 1 gram protein/kg Fluid: 1 ml/kcal  Diet Order: Diet regular Room service appropriate? Yes; Fluid consistency: Thin Pt is also offered choice of unit snacks mid-morning and mid-afternoon.  Pt is eating as desired.   Lab results and medications reviewed.   Fransisca KaufmannAllison Ioannides, MS, RDN, LDN 09/23/2017 9:22 AM

## 2017-09-23 NOTE — [facility] Group Notes (Signed)
[facility]  Date/Time:  09/23/2017  11:00AM-12:00PM  Type of Therapy and Topic:  Group Therapy:  Music and Mood  Participation Level:  Active   Description of Group: In this process group, members listened to a variety of genres of music and identified that different types of music evoke different responses.  Patients were encouraged to identify music that was soothing for them and music that was energizing for them.  Patients discussed how this knowledge can help with wellness and recovery in various ways including managing depression and anxiety as well as encouraging healthy sleep habits.    Therapeutic Goals: 1. Patients will explore the impact of different varieties of music on mood 2. Patients will verbalize the thoughts they have when listening to different types of music 3. Patients will identify music that is soothing to them as well as music that is energizing to them 4. Patients will discuss how to use this knowledge to assist in maintaining wellness and recovery 5. Patients will explore the use of music as a coping skill  Summary of Patient Progress:  At the beginning of group, patient expressed that he was feeling joyful.  He enjoyed the music, stated that it was very helpful to him and music is often his "go to."  He stated at the end of the group he felt great.  Therapeutic Modalities: Solution Focused Brief Therapy Motivational Interviewing Activity   Ambrose MantleMareida Grossman-Orr, LCSW 09/23/2017 8:24 AM

## 2017-09-23 NOTE — Plan of Care (Signed)
Pt safe on unit at this time, pt stated he got the best sleep last night

## 2017-09-23 NOTE — [facility] Group Notes (Signed)
[facility] Group Notes:  (Nursing)  Date:  09/23/2017  Time:  1:30 PM Type of Therapy:  Nurse Education  Participation Level:  Active  Participation Quality:  Appropriate  Affect:  Appropriate  Cognitive:  Appropriate  Insight:  Appropriate  Engagement in Group:  Engaged  Modes of Intervention:  Discussion  Summary of Progress/Problems:  Pt answered questions appropriately and was engaged in group  Shela NevinValerie S Torina Ey 09/23/2017, 4:01 PM

## 2017-09-24 LAB — HEMOGLOBIN A1C
HEMOGLOBIN A1C: 5.4 % (ref 4.8–5.6)
MEAN PLASMA GLUCOSE: 108 mg/dL

## 2017-09-24 LAB — PROLACTIN: Prolactin: 33.3 ng/mL — ABNORMAL HIGH (ref 4.0–15.2)

## 2017-09-24 MED ORDER — OLANZAPINE 10 MG PO TABS
10.0000 mg | ORAL_TABLET | Freq: Every day | ORAL | Status: DC
  Administered 2017-09-24: 10 mg via ORAL
  Filled 2017-09-24 (×2): qty 1

## 2017-09-24 NOTE — Progress Notes (Signed)
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 Pt stated he has an "unstable" living environment.  Coping Skills:   Isolate, Substance Abuse, Exercise, Art/Dance, Talking, Music, Sports  Personal Challenges: Anger, Communication, Decision-Making, Relationships, School Performance, Self-Esteem/Confidence, Substance Abuse, Time Management, Trusting Others, Work Nutritional therapisterformance  Leisure Interests (2+):  Art - Draw, Music - Singing, Sports - Basketball, Sports - Football, Sports - Music therapistBaseball  Awareness of Community Resources:  Yes  Community Resources:  Other (Comment)(Track; basketball court/gym; football field; pool)  Current Use: Yes  Patient Strengths:  Artistic abilities; cooking skills  Patient Identified Areas of Improvement:  Artistic abilities; cooking skills  Current Recreation Participation:  Everyday  Patient Goal for Hospitalization:  "Improve myself"  [place]  [place]  Current [place] (including self-harm):  No  Current HI:  No  Consent to Intern Participation: N/A   Caroll RancherMarjette Gerald Kuehl, LRT/CTRS  Caroll RancherLindsay, Patricia Fargo A 09/24/2017, 2:44 PM

## 2017-09-24 NOTE — Progress Notes (Signed)
Adult Psychoeducational Group Note  Date:  09/24/2017 Time:  9:48 PM  Group Topic/Focus:  Wrap-Up Group:   The focus of this group is to help patients review their daily goal of treatment and discuss progress on daily workbooks.  Participation Level:  Minimal  Participation Quality:  Appropriate  Affect:  Appropriate  Cognitive:  Oriented  Insight: Limited  Engagement in Group:  Engaged  Modes of Intervention:  Socialization and Support  Additional Comments:  Patient attended and participated in group tonight. He reports having a great day.  He went to the gym with the group and played basketball against the staff. He took his medication, did some drawing, wrote down his coping skills and took a power nap.  Lita MainsFrancis, Anuar Walgren [facility] 09/24/2017, 9:48 PM

## 2017-09-24 NOTE — Tx Team (Signed)
Interdisciplinary Treatment and Diagnostic Plan Update  09/24/2017 Time of Session: 1:23 PM  Jonathan Curry MRN: [id number]  Principal Diagnosis: Psychosis [facility])  Secondary Diagnoses: Principal Problem:   Psychosis ([place]) Active Problems:   Schizophreniform disorder ([place])   Current Medications:  Current Facility-Administered Medications  Medication Dose Route Frequency Provider Last Rate Last Dose  . acetaminophen (TYLENOL) tablet 650 mg  650 mg Oral Q6H PRN Patrecia Pour, NP      . alum & mag hydroxide-simeth (MAALOX/MYLANTA) 200-200-20 MG/5ML suspension 30 mL  30 mL Oral Q4H PRN Patrecia Pour, NP      . benztropine (COGENTIN) tablet 0.5 mg  0.5 mg Oral BID Derrill Center, NP   0.5 mg at 09/24/17 0811  . feeding supplement (ENSURE ENLIVE) (ENSURE ENLIVE) liquid 237 mL  237 mL Oral BID BM Pennelope Bracken, MD      . hydrOXYzine (ATARAX/VISTARIL) tablet 25 mg  25 mg Oral TID PRN Patrecia Pour, NP   25 mg at 09/23/17 2117  . magnesium hydroxide (MILK OF MAGNESIA) suspension 30 mL  30 mL Oral Daily PRN Patrecia Pour, NP      . OLANZapine (ZYPREXA) tablet 10 mg  10 mg Oral QHS Pennelope Bracken, MD      . traZODone (DESYREL) tablet 50 mg  50 mg Oral QHS,MR X 1 Derrill Center, NP   50 mg at 09/23/17 2117    PTA Medications: No medications prior to admission.    Patient Stressors: Network engineer difficulties Substance abuse  Patient Strengths: Solicitor fund of knowledge Special hobby/interest Other: Art major-pencil drawing  Treatment Modalities: Medication Management, Group therapy, Case management,  1 to 1 session with clinician, Psychoeducation, Recreational therapy.   Physician Treatment Plan for Primary Diagnosis: Psychosis ([place]) Long Term Goal(s): Improvement in symptoms so as ready for discharge  Short Term Goals: Ability to demonstrate self-control will improve Ability to maintain clinical measurements within normal limits will  improve Compliance with prescribed medications will improve Ability to identify changes in lifestyle to reduce recurrence of condition will improve Ability to verbalize feelings will improve Ability to identify and develop effective coping behaviors will improve  Medication Management: Evaluate patient's response, side effects, and tolerance of medication regimen.  Therapeutic Interventions: 1 to 1 sessions, Unit Group sessions and Medication administration.  Evaluation of Outcomes: Progressing  Physician Treatment Plan for Secondary Diagnosis: Principal Problem:   Psychosis ([place]) Active Problems:   Schizophreniform disorder ([place])   Long Term Goal(s): Improvement in symptoms so as ready for discharge  Short Term Goals: Ability to demonstrate self-control will improve Ability to maintain clinical measurements within normal limits will improve Compliance with prescribed medications will improve Ability to identify changes in lifestyle to reduce recurrence of condition will improve Ability to verbalize feelings will improve Ability to identify and develop effective coping behaviors will improve  Medication Management: Evaluate patient's response, side effects, and tolerance of medication regimen.  Therapeutic Interventions: 1 to 1 sessions, Unit Group sessions and Medication administration.  Evaluation of Outcomes: Progressing   RN Treatment Plan for Primary Diagnosis: Psychosis ([place]) Long Term Goal(s): Knowledge of disease and therapeutic regimen to maintain health will improve  Short Term Goals: Ability to identify and develop effective coping behaviors will improve and Compliance with prescribed medications will improve  Medication Management: RN will administer medications as ordered by provider, will assess and evaluate patient's response and provide education to patient for prescribed medication. RN will report any adverse  and/or side effects to prescribing  provider.  Therapeutic Interventions: 1 on 1 counseling sessions, Psychoeducation, Medication administration, Evaluate responses to treatment, Monitor vital signs and CBGs as ordered, Perform/monitor CIWA, COWS, AIMS and Fall Risk screenings as ordered, Perform wound care treatments as ordered.  Evaluation of Outcomes: Progressing   LCSW Treatment Plan for Primary Diagnosis: Psychosis ([place]) Long Term Goal(s): Safe transition to appropriate next level of care at discharge, Engage patient in therapeutic group addressing interpersonal concerns.  Short Term Goals: Engage patient in aftercare planning with referrals and resources  Therapeutic Interventions: Assess for all discharge needs, 1 to 1 time with Social worker, Explore available resources and support systems, Assess for adequacy in community support network, Educate family and significant other(s) on suicide prevention, Complete Psychosocial Assessment, Interpersonal group therapy.  Evaluation of Outcomes: Met     Progress in Treatment: Attending groups: Yes Participating in groups: Yes Taking medication as prescribed: Yes Toleration medication: Yes, no side effects reported at this time Family/Significant other contact made: Yes Patient understands diagnosis: Yes AEB asking for help with  Discussing patient identified problems/goals with staff: Yes Medical problems stabilized or resolved: Yes Denies suicidal/homicidal ideation: Yes Issues/concerns per patient self-inventory: None Other: N/A  New problem(s) identified: None identified at this time.   New Short Term/Long Term Goal(s): None identified at this time.   Discharge Plan or Barriers:   Reason for Continuation of Hospitalization: Disorganization Delusions   Medication stabilization   Estimated Length of Stay: Likely d/c tomorrow  Attendees: Patient: 09/24/2017  1:23 PM  Physician: Maris Berger, MD 09/24/2017  1:23 PM  Nursing: Sena Hitch, RN  09/24/2017  1:23 PM  RN Care Manager: Lars Pinks, RN 09/24/2017  1:23 PM  Social Worker: Ripley Fraise 09/24/2017  1:23 PM  Recreational Therapist: Winfield Cunas 09/24/2017  1:23 PM  Other: Norberto Sorenson 09/24/2017  1:23 PM  Other:  09/24/2017  1:23 PM    Scribe for Treatment Team:  Roque Lias LCSW 09/24/2017 1:23 PM

## 2017-09-24 NOTE — Progress Notes (Signed)
Recreation Therapy Notes  Date: 09/24/17 Time: 1000 Location: [address] Dayroom  Group Topic: Coping Skills  Goal Area(s) Addresses:  Patient will be able to identify positive coping skills. Patient will be able to identify the importance of using coping skills. Patient will be able to identify the benefits of using coping skills post d/c.  Behavioral Response: Minimal  Intervention: Mind map, pencils, white board, marker, eraser  Activity: Mind map.  Patients were given Curry blank mind map.  LRT and patients filled in the first eight boxes together with anxiety, mourning Curry loss, depression, stop smoking, not being able to sleep, anger, loneliness and pain.  Individually, patients were to identify at least three coping skills for each situation identified.  Group would come back together and LRT would right the coping skills identified on the board.  Education: PharmacologistCoping Skills, Building control surveyorDischarge Planning.   Education Outcome: Acknowledges understanding/In group clarification offered/Needs additional education.   Clinical Observations/Feedback: Pt arrived late.  Pt stated some coping skills were think of memories for mourning Curry loss; stop gradually for stop smoking; think happy thoughts for depression; love yourself for loneliness and prayer for pain.    Caroll RancherMarjette Odyssey Curry, LRT/CTRS       Caroll RancherLindsay, Jonathan Curry 09/24/2017 12:08 PM

## 2017-09-24 NOTE — Progress Notes (Signed)
[facility] MD Progress Note  09/24/2017 2:36 PM Jonathan Curry  MRN:  [id number] Subjective: Jonathan Curry reports " I am doing okay, I guess."    Objective: Jonathan Curry was evaluated by MD and NP. Jonathan Curry reports feeling better than when he was admitted. Reports he feels the Zyprexa has help with his mood and stress levels. Reports feeling tired with taken medication. Medication time adjustment was made. Patient denies a denies auditory or visual hallucinations. Patient seen attending group session. Reports good appetite and states he is resting well.Support, encouragement and reassurance was provided.    Principal Problem: Psychosis (HCC) Diagnosis:   Patient Active Problem List   Diagnosis Date Noted  . Psychosis (HCC) [F29] 09/21/2017  . 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 Sleep apnea    History reviewed. No pertinent surgical history. Family History: History reviewed. No pertinent family history. Family Psychiatric  History:  Social History:  Social History   Substance and Sexual Activity  Alcohol Use Yes     Social History   Substance and Sexual Activity  Drug Use Yes  . Types: Marijuana    Social History   Socioeconomic History  . Marital status: Single    Spouse name: None  . Number of children: None  . Years of education: None  . Highest education level: None  Social Needs  . Financial resource strain: None  . Food insecurity - worry: None  . Food insecurity - inability: None  . Transportation needs - medical: None  . Transportation needs - non-medical: None  Occupational History  . None  Tobacco Use  . Smoking status: Former Games developermoker  . Smokeless tobacco: Never Used  Substance and Sexual Activity  . Alcohol use: Yes  . Drug  use: Yes    Types: Marijuana  . Sexual activity: None  Other Topics Concern  . None  Social History Narrative  . None   Additional Social History:                         Sleep: Fair  Appetite:  Fair  Current Medications: Current Facility-Administered Medications  Medication Dose Route Frequency Provider Last Rate Last Dose  . acetaminophen (TYLENOL) tablet 650 mg  650 mg Oral Q6H PRN Charm RingsLord, Jamison Y, NP      . alum & mag hydroxide-simeth (MAALOX/MYLANTA) 200-200-20 MG/5ML suspension 30 mL  30 mL Oral Q4H PRN Charm RingsLord, Jamison Y, NP      . benztropine (COGENTIN) tablet 0.5 mg  0.5 mg Oral BID Oneta RackLewis, Tanika N, NP   0.5 mg at 09/24/17 0811  . feeding supplement (ENSURE ENLIVE) (ENSURE ENLIVE) liquid 237 mL  237 mL Oral BID BM Micheal Likensainville, [name] T, MD      . hydrOXYzine (ATARAX/VISTARIL) tablet 25 mg  25 mg Oral TID PRN Charm RingsLord, Jamison Y, NP   25 mg at 09/23/17 2117  . magnesium hydroxide (MILK OF MAGNESIA) suspension 30 mL  30 mL Oral Daily PRN Charm RingsLord, Jamison Y, NP      . OLANZapine (ZYPREXA) tablet 10 mg  10 mg Oral QHS Micheal Likensainville, [name] T, MD      . traZODone (DESYREL) tablet 50 mg  50 mg Oral QHS,MR X 1 Oneta RackLewis, Tanika N, NP   50 mg at 09/23/17 2117  Lab Results:  Results for orders placed or performed during the hospital encounter of 09/21/17 (from the past 48 hour(s))  TSH     Status: None   Collection Time: 09/23/17  6:30 AM  Result Value Ref Range   TSH 2.692 0.350 - 4.500 uIU/mL    Comment: Performed by a 3rd Generation assay with a functional sensitivity of <=0.01 uIU/mL. Performed at [facility], [address] [place], [place] [address]   Prolactin     Status: Abnormal   Collection Time: 09/23/17  6:30 AM  Result Value Ref Range   Prolactin 33.3 (H) 4.0 - 15.2 ng/mL    Comment: (NOTE) Performed At: [facility] [address] [place], [place] [id number] Jolene SchimkeNagendra Sanjai MD OZ:[phone] Performed at [facility], [address] [place], [place] [address]   Hemoglobin A1c     Status: None   Collection Time: 09/23/17  6:30 AM  Result Value Ref Range   Hgb A1c MFr Bld 5.4 4.8 - 5.6 %    Comment: (NOTE)         Prediabetes: 5.7 - 6.4         Diabetes: >6.4         Glycemic control for adults with diabetes: <7.0    Mean Plasma Glucose 108 mg/dL    Comment: (NOTE) Performed At: [facility] [address] [place], [place] [id number] Jolene SchimkeNagendra Sanjai MD UU:[phone] Performed at [facility], [address] [place], [place] [address]   Lipid panel     Status: None   Collection Time: 09/23/17  6:30 AM  Result Value Ref Range   Cholesterol 130 0 - 200 mg/dL   Triglycerides 64 <956<150 mg/dL   HDL 53 >38>40 mg/dL   Total CHOL/HDL Ratio 2.5 RATIO   VLDL 13 0 - 40 mg/dL   LDL Cholesterol 64 0 - 99 mg/dL    Comment:        Total Cholesterol/HDL:CHD Risk Coronary Heart Disease Risk Table                     Men   Women  1/2 Average Risk   3.4   3.3  Average Risk       5.0   4.4  2 X Average Risk   9.6   7.1  3 X Average Risk  23.4   11.0        Use the calculated Patient Ratio above and the CHD Risk Table to determine the patient's CHD Risk.        ATP III CLASSIFICATION (LDL):  <100     mg/dL   Optimal  [id number]  mg/dL   Near or Above                    Optimal  130-159  mg/dL   Borderline  [id number]  mg/dL   High  >416>190     mg/dL   Very High Performed at [facility], [address] [place], [place] [address]     Blood Alcohol level:  Lab Results  Component Value Date   [facility] <10 09/20/2017    Metabolic Disorder Labs: Lab Results  Component Value Date   HGBA1C 5.4 09/23/2017   MPG 108 09/23/2017   Lab Results  Component Value Date   PROLACTIN 33.3 (H) 09/23/2017   Lab Results  Component Value Date   CHOL 130 09/23/2017   TRIG 64 09/23/2017  HDL 53 09/23/2017   CHOLHDL 2.5 09/23/2017   VLDL 13 09/23/2017   LDLCALC 64 09/23/2017     Physical Findings: AIMS: Facial and Oral Movements Muscles of Facial Expression: None, normal Lips and Perioral Area: None, normal Jaw: None, normal Tongue: None, normal,Extremity Movements Upper (arms, wrists, hands, fingers): None, normal Lower (legs, knees, ankles, toes): None, normal, Trunk Movements Neck, shoulders, hips: None, normal, Overall Severity Severity of abnormal movements (highest score from questions above): None, normal Incapacitation due to abnormal movements: None, normal Patient's awareness of abnormal movements (rate only patient's report): No Awareness, Dental Status Current problems with teeth and/or dentures?: No Does patient usually wear dentures?: No  CIWA:    COWS:     Musculoskeletal: Strength & Muscle Tone: within normal limits Gait & Station: normal Patient leans: N/A  Psychiatric Specialty Exam: Physical Exam  Nursing note and vitals reviewed. Constitutional: He is oriented to person, place, and time. He appears well-developed.  Cardiovascular: Normal rate.  Neurological: He is alert and oriented to person, place, and time.  Psychiatric: He has a normal mood and affect. His behavior is normal.    Review of Systems  Psychiatric/Behavioral: Positive for depression and hallucinations. The patient is nervous/anxious.     Blood pressure 125/83, pulse 66, temperature 98.4 F (36.9 C), temperature source Oral, resp. rate 16, height 5\' 8"  (1.727 m), weight 51.3 kg (113 lb), SpO2 100 %.Body mass index is 17.18 kg/m.  General Appearance: Casual and Fairly Groomed  Eye Contact:  Good  Speech:  Clear and Coherent  Volume:  Normal  Mood:  Dysphoric  Affect:  Depressed and Flat  Thought Process:  Linear and Descriptions of Associations: Circumstantial  Orientation:  Full (Time, Place, and Person)  Thought Content:  Rumination hallucination was denied today  Suicidal Thoughts:  No  Homicidal Thoughts:  No  Memory:  Immediate;   Fair Recent;    Fair Remote;   Fair  Judgement:  Intact  Insight:  Lacking  Psychomotor Activity:  Normal patient is visible throughput the milieu   Concentration:  Concentration: Poor  Recall:  [employer] of Knowledge:  Fair  Language:  Good  Akathisia:  No  Handed:  Right  AIMS (if indicated):     Assets:  Communication Skills Desire for Improvement Resilience Social Support  ADL's:  Intact  Cognition:  WNL  Sleep:  Number of Hours: 6     Treatment Plan Summary: Daily contact with patient to assess and evaluate symptoms and progress in treatment and Medication management  Continue with current treatment plan on 09/24/2017 except where noted  Continue   Zyprexa 10 mg QHS for mood stabilization. Continue Cogentin  0.5 mg PO BID for EPS  Continue with Trazodone 50 mg for insomnia  Will continue to monitor vitals ,medication compliance and treatment side effects while patient is here.  Reviewed labs:,BAL -, UDS - pos thc  CSW will start working on disposition.  Patient to participate in therapeutic milieu    Oneta Rack, NP 09/24/2017, 2:36 PM
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
## 2017-09-24 NOTE — [facility] Suicide Risk Assessment (Signed)
[facility] INPATIENT:  Family/Significant Other Suicide Prevention Education  Suicide Prevention Education:  Education Completed; No one has been identified by the patient as the family member/significant other with whom the patient will be residing, and identified as the person(s) who will aid the patient in the event of a mental health crisis (suicidal ideations/suicide attempt).  With written consent from the patient, the family member/significant other has been provided the following suicide prevention education, prior to the and/or following the discharge of the patient.  The suicide prevention education provided includes the following:  Suicide risk factors  Suicide prevention and interventions  National Suicide Hotline telephone number  [facility] assessment telephone number  [facility] Emergency Assistance 911  [facility] and/or Residential Mobile Crisis Unit telephone number  Request made of family/significant other to:  Remove weapons (e.g., guns, rifles, knives), all items previously/currently identified as safety concern.    Remove drugs/medications (over-the-counter, prescriptions, illicit drugs), all items previously/currently identified as a safety concern.  The family member/significant other verbalizes understanding of the suicide prevention education information provided.  The family member/significant other agrees to remove the items of safety concern listed above. The patient did not endorse SI at the time of admission, nor did the patient c/o SI during the stay here.  SPE not required.  Jonathan Curry 09/24/2017, 4:24 PM

## 2017-09-24 NOTE — Progress Notes (Signed)
DAR NOTE: Patient presents with anxious affect and mood.  Denies pain, auditory and visual hallucinations.  Described energy level as normal and concentration as good.  Rates depression at 0, hopelessness at 0, and anxiety at 2.  Maintained on routine safety checks.  Medications given as prescribed.  Support and encouragement offered as needed.  Attended group and participated.  States goal for today is "stay consistently happy."  Patient visible in the dayroom with minimal interaction with staff and peers.  Complain of medication causing drowsiness.  MD made aware.

## 2017-09-25 MED ORDER — TRAZODONE HCL 50 MG PO TABS: ORAL_TABLET | ORAL | 0 refills | Status: AC

## 2017-09-25 MED ORDER — BENZTROPINE MESYLATE 0.5 MG PO TABS: 0.5000 mg | ORAL_TABLET | Freq: Two times a day (BID) | ORAL | 0 refills | Status: AC

## 2017-09-25 MED ORDER — OLANZAPINE 10 MG PO TABS: 10.0000 mg | ORAL_TABLET | Freq: Every day | ORAL | 0 refills | Status: AC

## 2017-09-25 MED ORDER — HYDROXYZINE HCL 25 MG PO TABS: 25.0000 mg | ORAL_TABLET | Freq: Three times a day (TID) | ORAL | 0 refills | Status: AC | PRN

## 2017-09-25 NOTE — Progress Notes (Signed)
Pt slept 2 hrs last night

## 2017-09-25 NOTE — Progress Notes (Signed)
Recreation Therapy Notes  09/25/17 1250:  LRT met with pt and gave him a packet on stress management techniques.  LRT went over the techniques with pt and explained he didn't have to take long periods of time to practice them.  LRT explained to pt he could start off with five minutes then work his way up to doing the techniques longer.  Pt was receptive and appreciative of the information.   Jonathan Curry, LRT/CTRS    Ria Comment, Dakiyah Heinke A 09/25/2017 1:07 PM

## 2017-09-25 NOTE — Progress Notes (Signed)
Patient discharged to lobby. Patient was stable and appreciative at that time. All papers and prescriptions were given and valuables returned. Verbal understanding expressed. Denies SI/HI and A/VH. Patient given opportunity to express concerns and ask questions.  

## 2017-09-25 NOTE — Progress Notes (Signed)
Recreation Therapy Notes  Date: 09/25/17 Time: 0950 Location: [address] Dayroom  Group Topic: Goal Setting  Goal Area(s) Addresses:  Patient will be able to identify at least 3 life goals.  Patient will be able to identify benefit of investing in life goals.  Patient will be able to identify benefit of setting life goals.   Behavioral Response:  Engaged  Intervention: Worksheet  Activity: Life Goals.  Patients were given a worksheet broken down into 6 categories (family, friends, work/school, spirituality, body and mental health).  Patients were to identify what they were doing well, what they need to improve and make a goal to complete the improvement.  Education:  Discharge Planning, Coping Skills, Life Goals  Education Outcome: Acknowledges Education/In Group Clarification Provided/Needs Additional Education  Clinical Observations: Pt stated "goals are a road map to help you in life".  Pt stated for body, he does well taking long showers, needs to improve being more active and set a goal of passing away due to old age at [age]; family, pt stated he does well loving them, needs to improve commitment and set a goal of going on a cruise next year; and work/school does well at setting goals; needs to do better at scheduling classes and set a goal of graduating in the spring.    Caroll RancherMarjette Andrez Lieurance, LRT/CTRS      Caroll RancherLindsay, Griselda Bramblett A 09/25/2017 11:26 AM

## 2017-09-25 NOTE — Progress Notes (Signed)
Recreation Therapy Notes  INPATIENT RECREATION TR PLAN  Patient Details Name: Ovila Lepage MRN: [id number] DOB: 23-Mar-1991 Today's Date: 09/25/2017  Rec Therapy Plan Is patient appropriate for Therapeutic Recreation?: Yes Treatment times per week: about 3 days Estimated Length of Stay: 5-7 days TR Treatment/Interventions: Group participation (Comment)  Discharge Criteria Pt will be discharged from therapy if:: Discharged Treatment plan/goals/alternatives discussed and agreed upon by:: Patient/family  Discharge Summary Short term goals set: Pt will be able to demonstrate understanding of stress management techniques. Short term goals met: Complete Progress toward goals comments: Groups attended Which groups?: Coping skills, Goal setting Reason goals not met: None Therapeutic equipment acquired: N/A Reason patient discharged from therapy: Discharge from hospital Pt/family agrees with progress & goals achieved: Yes Date patient discharged from therapy: 09/25/17   Victorino Sparrow, LRT/CTRS  Ria Comment, Nathaneil Feagans A 09/25/2017, 1:14 PM
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
## 2017-09-25 NOTE — Progress Notes (Signed)
Pt stated he was having a hard time going to sleep, pt finally took his repeat dose of Trazodone after much encouragement.

## 2017-09-25 NOTE — Progress Notes (Signed)
D: Pt denies SI/HI/AVH. Pt is pleasant and cooperative. Pt stated the Zyprexa was making him drowsy in the morning. Pt was informed that he was given Vistaril and Trazodone with the Zyprexa the past few nights. Pt was informed that the Vistaril was as needed so we would try to skip it to night to see how he felt in the morning and he said he would try .   A: Pt was offered support and encouragement. Pt was given scheduled medications. Pt was encourage to attend groups. Q 15 minute checks were done for safety.   R: safety maintained on unit.

## 2017-09-25 NOTE — Plan of Care (Signed)
Pt was able to demonstrate understanding of stress management techniques after meeting with LRT 1:1.   Caroll RancherMarjette Adela Esteban, LRT/CTRS

## 2017-09-25 NOTE — Discharge Summary (Signed)
Physician Discharge Summary Note  Patient:  Jonathan Curry is an 26 y.o., male  MRN:  [id number]  DOB:  1990-12-15  Patient phone:  [phone] (home)   Patient address:   [place] [address],   Total Time spent with patient: Greater than 30 minutes  Date of Admission:  09/21/2017 Date of Discharge: 09-26-17  Reason for Admission: Bizarre behavior, selective mutism & stereotypical movement.  Principal Problem: Psychosis [facility])  Discharge Diagnoses: Patient Active Problem List   Diagnosis Date Noted  . Psychosis (HCC) [F29] 09/21/2017  . 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  Past Medical History:  Past Medical History:  Diagnosis Date  . Sleep apnea    History reviewed. No pertinent surgical history.  Family History: History reviewed. No pertinent family history.  Family Psychiatric  History: See H&P  Social History:  Social History   Substance and Sexual Activity  Alcohol Use Yes     Social History   Substance and Sexual Activity  Drug Use Yes  . Types: Marijuana    Social History   Socioeconomic History  . Marital status: Single    Spouse name: None  . Number of children: None  . Years of education: None  . Highest education level: None  Social Needs  . Financial resource strain: None  . Food insecurity - worry: None  . Food insecurity - inability: None  . Transportation needs - medical: None  . Transportation needs - non-medical: None  Occupational History  . None  Tobacco Use  . Smoking status: Former Games developer  . Smokeless tobacco: Never Used  Substance and Sexual Activity  . Alcohol use: Yes  . Drug use: Yes    Types: Marijuana  . Sexual activity: None  Other Topics Concern  . None  Social History Narrative  . None   Hospital Course:  Jonathan Curry is a 26 year old  male, single , no children, college student at [employer]. Lives with friends. He reports he was in the [employer]	[place], honorable discharge in 2014. Patient is fair historian. Reports he is feeling better. As per chart notes, he was brought in via IVC due to bizarre behaviors, staring into the distance, being selectively mute, stereotyped hand movements, inappropriate affect , and appearing to be internally preoccupied . In the ED Patient acknowledged he was not communicating and " not myself ", which he attributes to having been tired and " stressed ". In ED presented with odd behaviors & reporting that he had visions of God leading [place]. Today he minimizes that, states " I guess it was a dream". Denies alcohol abuse, endorses occasional cannabis use- admission UDS positive for cannabis, otherwise negative, BAL <10. At this time states he is feeling well , denies depression, does not endorse neuro-vegetative symptoms of depression  After the above admission assessment, Jonathan Curry was started on the medication regimen for his presenting symptoms. He received & was discharged on; on Zyprexa 10 mg for mood control, Cogentin 0.5 mg for EPS, Hydroxyzine 25 mg prn for anxiety & Trazodone 50 mg for insomnia. He was also enrolled & participated in the group counseling sessions being offered & held on this unit. He learned coping skills. Jonathan Curry presented no other significant health issues that required treatment & or monitoring. He tolerated his treatment regimen without any adverse effects or reactions reported.  During the course of his hospitalization, Jonathan Curry was seen & assessed by the providers on daily basis to assure his symptoms were responding well to his treatment regimen. And as his treatment progressed, improvement of his symptoms were noted by his reports of symptom reduction, presentation of good affects & participation in the group counseling sessions.  Today upon his discharge  evaluation by the attending psychiatrist, pt reports he is doing well overall. He denies SI/HI/AH/VH. He is sleeping well and appetite is good. He is tolerating his medications without difficulty or side effects. He is looking forward to discharge so that he can visit with his brother and get back to his classes. Pt was able to engage in safety planning including [facility] if he feels unable to maintain his own safety or the safety of others. He will continue mental health services & medication management as noted below.  [facility] in no apparent distress. Transportation per the city bus. [facility] assisted with bus pass.  Physical Findings: AIMS: Facial and Oral Movements Muscles of Facial Expression: None, normal Lips and Perioral Area: None, normal Jaw: None, normal Tongue: None, normal,Extremity Movements Upper (arms, wrists, hands, fingers): None, normal Lower (legs, knees, ankles, toes): None, normal, Trunk Movements Neck, shoulders, hips: None, normal, Overall Severity Severity of abnormal movements (highest score from questions above): None, normal Incapacitation due to abnormal movements: None, normal Patient's awareness of abnormal movements (rate only patient's report): No Awareness, Dental Status Current problems with teeth and/or dentures?: No Does patient usually wear dentures?: No  CIWA:    COWS:     Musculoskeletal: Strength & Muscle Tone: within normal limits Gait & Station: normal Patient leans: N/A  Psychiatric Specialty Exam: Physical Exam  Constitutional: He appears well-developed.  HENT:  Head: Normocephalic.  Eyes: Pupils are equal, round, and reactive to light.  Neck: Normal range of motion.  Cardiovascular: Normal rate.  Respiratory: Effort normal.  GI: Soft.  Genitourinary:  Genitourinary Comments: Deferred  Musculoskeletal: Normal range of motion.  Neurological: He is alert.  Skin: Skin is warm.    Review  of Systems  Constitutional: Negative.   HENT: Negative.   Eyes: Negative.   Respiratory: Negative.   Cardiovascular: Negative.   Gastrointestinal: Negative.   Genitourinary: Negative.   Skin: Negative.   Neurological: Negative.   Endo/Heme/Allergies: Negative.   Psychiatric/Behavioral: Positive for depression (Stable), hallucinations (Hx. psychosis) and substance abuse (Hx. THC use). Negative for memory loss and suicidal ideas. The patient has insomnia (Stabilized with medication prior to discharge). The patient is not nervous/anxious.     Blood pressure 111/70, pulse 73, temperature 97.8 F (36.6 C), temperature source Oral, resp. rate 16, height 5\' 8"  (1.727 m), weight 51.3 kg (113 lb), SpO2 100 %.Body mass index is 17.18 kg/m.  See Md's SRA   Have you used any form of tobacco in the last 30 days? (Cigarettes, Smokeless Tobacco, Cigars, and/or Pipes): No  Has this patient used any form of tobacco in the last 30 days? (Cigarettes, Smokeless Tobacco, Cigars, and/or Pipes): N/A  Blood Alcohol level:  Lab Results  Component Value Date   ETH <10 09/20/2017   Metabolic Disorder Labs:  Lab Results  Component Value Date   HGBA1C 5.4 09/23/2017   MPG 108 09/23/2017   Lab Results  Component Value Date   PROLACTIN 33.3 (H) 09/23/2017   Lab Results  Component Value Date   CHOL 130 09/23/2017   TRIG 64 09/23/2017  HDL 53 09/23/2017   CHOLHDL 2.5 09/23/2017   VLDL 13 09/23/2017   LDLCALC 64 09/23/2017   See Psychiatric Specialty Exam and Suicide Risk Assessment completed by Attending Physician prior to discharge.  Discharge destination:  Home  Is patient on multiple antipsychotic therapies at discharge:  No   Has Patient had three or more failed trials of antipsychotic monotherapy by history:  No  Recommended Plan for Multiple Antipsychotic Therapies: NA  Allergies as of 09/25/2017   No Known Allergies     Medication List    TAKE these medications     Indication   benztropine 0.5 MG tablet Commonly known as:  COGENTIN Take 1 tablet (0.5 mg total) by mouth 2 (two) times daily. For prevention of drug induced tremors  Indication:  Extrapyramidal Reaction caused by Medications   hydrOXYzine 25 MG tablet Commonly known as:  ATARAX/VISTARIL Take 1 tablet (25 mg total) by mouth 3 (three) times daily as needed for anxiety.  Indication:  Feeling Anxious   OLANZapine 10 MG tablet Commonly known as:  ZYPREXA Take 1 tablet (10 mg total) by mouth at bedtime. For mood control  Indication:  Mood control   traZODone 50 MG tablet Commonly known as:  DESYREL Take 1 tablet (50 mg) by mouth at bedtime: For sleep  Indication:  Trouble Sleeping      Follow-up Information    [facility] Follow up.   Why:  Go to the walk-in clinc the day you are discharged from the hospital, or the next day at the latest. They are open from 9-4.   Take along your hospital d/c paperwork Contact information: [address] [place]  [phone]         Follow-up recommendations: Activity:  As tolerated Diet: As recommended by your primary care doctor. Keep all scheduled follow-up appointments as recommended.   Comments: Patient is instructed prior to discharge to: Take all medications as prescribed by his/her mental healthcare provider. Report any adverse effects and or reactions from the medicines to his/her outpatient provider promptly. Patient has been instructed & cautioned: To not engage in alcohol and or illegal drug use while on prescription medicines. In the event of worsening symptoms, patient is instructed to call the crisis hotline, 911 and or go to the nearest ED for appropriate evaluation and treatment of symptoms. To follow-up with his/her primary care provider for your other medical issues, concerns and or health care needs.   Signed: Sanjuana KavaNwoko, Agnes I, NP, PMHNP, FNP-BC 09/26/2017, 9:42 AM   Patient seen, Suicide Assessment Completed.   Disposition Plan Reviewed   Mamie Nickntonio Vilardi is a 26 y/o M who was admitted with symptoms of agitation and psychosis. He was started on zyprexa and he had improvement of his symptoms. Today upon evaluation, pt reports he is doing well overall. He denies SI/HI/AH/VH. He is sleeping well and appetite is good. He is tolerating his medications without difficulty or side effects. He is looking forward to discharge so that he can visit with his brother and get back to his classes. Pt was able to engage in safety planning including plan [facility] if he feels unable to maintain his own safety or the safety of others. He had no further questions, comments, or concerns.   Plan Of Care/Follow-up recommendations:   -Discharge to outpatient level of care - Psychosis unspecified             - Continue zyprexa 10mg  qhs - Insomnia             -  Continue trazodone 50mg  qhs - EPS             - Continue cogentin 0.5mg  po BID  Activity:  as tolerated Diet:  normal Tests:  NA Other:  see above for DC plan  Micheal Likens, MD

## 2017-09-25 NOTE — Progress Notes (Signed)
  [facility] Discharge Plan :  Will you be returning to the same living situation after discharge:  Yes,  home At discharge, do you have transportation home?: Yes,  bus pass Do you have the ability to pay for your medications: Yes,  insurance  Release of information consent forms completed and in the chart;  Patient's signature needed at discharge.  Patient to Follow up at: Follow-up Information    [facility] Follow up.   Why:  Go to the walk-in clinc the day you are discharged from the hospital, or the next day at the latest. They are open from 9-4.   Take along your hospital d/c paperwork Contact information: [address] [place]  [phone]          Next level of care provider [facility] Link:no  Safety Planning and Suicide Prevention discussed: Yes,  yes  Have you used any form of tobacco in the last 30 days? (Cigarettes, Smokeless Tobacco, Cigars, and/or Pipes): No  Has patient been referred to the Quitline?: N/A patient is not a smoker  Patient has been referred for addiction treatment: Pt. refused referral  Ida RogueRodney B Ruchama Kubicek, LCSW 09/25/2017, 1:54 PM
